# Patient Record
Sex: Male | Born: 1945 | Race: White | Hispanic: No | Smoking: Former smoker
Health system: Southern US, Community
[De-identification: ages and names within clinical notes are randomized; demographics above are authoritative.]

## PROBLEM LIST (undated history)

## (undated) DIAGNOSIS — I2510 Atherosclerotic heart disease of native coronary artery without angina pectoris: Secondary | ICD-10-CM

## (undated) DIAGNOSIS — I6529 Occlusion and stenosis of unspecified carotid artery: Secondary | ICD-10-CM

## (undated) DIAGNOSIS — I5022 Chronic systolic (congestive) heart failure: Secondary | ICD-10-CM

## (undated) DIAGNOSIS — I255 Ischemic cardiomyopathy: Secondary | ICD-10-CM

## (undated) DIAGNOSIS — I252 Old myocardial infarction: Secondary | ICD-10-CM

## (undated) DIAGNOSIS — E785 Hyperlipidemia, unspecified: Secondary | ICD-10-CM

## (undated) HISTORY — DX: Chronic systolic (congestive) heart failure: I50.22

## (undated) HISTORY — DX: Ischemic cardiomyopathy: I25.5

## (undated) HISTORY — DX: Atherosclerotic heart disease of native coronary artery without angina pectoris: I25.10

## (undated) HISTORY — DX: Hyperlipidemia, unspecified: E78.5

## (undated) HISTORY — DX: Occlusion and stenosis of unspecified carotid artery: I65.29

## (undated) HISTORY — PX: APPENDECTOMY: SHX54

## (undated) HISTORY — PX: PERCUTANEOUS CORONARY STENT INTERVENTION (PCI-S): SHX6016

## (undated) HISTORY — PX: HIP FRACTURE SURGERY: SHX118

## (undated) HISTORY — DX: Old myocardial infarction: I25.2

---

## 2014-09-28 DIAGNOSIS — D72829 Elevated white blood cell count, unspecified: Secondary | ICD-10-CM | POA: Diagnosis not present

## 2014-09-28 DIAGNOSIS — R110 Nausea: Secondary | ICD-10-CM | POA: Diagnosis not present

## 2014-09-28 DIAGNOSIS — S0101XA Laceration without foreign body of scalp, initial encounter: Secondary | ICD-10-CM | POA: Diagnosis not present

## 2014-09-28 DIAGNOSIS — S0990XA Unspecified injury of head, initial encounter: Secondary | ICD-10-CM | POA: Diagnosis not present

## 2014-09-28 DIAGNOSIS — J439 Emphysema, unspecified: Secondary | ICD-10-CM | POA: Diagnosis not present

## 2014-09-28 DIAGNOSIS — J449 Chronic obstructive pulmonary disease, unspecified: Secondary | ICD-10-CM | POA: Diagnosis not present

## 2014-09-28 DIAGNOSIS — R0989 Other specified symptoms and signs involving the circulatory and respiratory systems: Secondary | ICD-10-CM | POA: Diagnosis not present

## 2014-09-28 DIAGNOSIS — F17200 Nicotine dependence, unspecified, uncomplicated: Secondary | ICD-10-CM | POA: Diagnosis not present

## 2014-09-28 DIAGNOSIS — R739 Hyperglycemia, unspecified: Secondary | ICD-10-CM | POA: Diagnosis not present

## 2014-09-28 DIAGNOSIS — S0191XA Laceration without foreign body of unspecified part of head, initial encounter: Secondary | ICD-10-CM | POA: Diagnosis not present

## 2014-09-28 DIAGNOSIS — R61 Generalized hyperhidrosis: Secondary | ICD-10-CM | POA: Diagnosis not present

## 2014-09-28 DIAGNOSIS — F17210 Nicotine dependence, cigarettes, uncomplicated: Secondary | ICD-10-CM | POA: Diagnosis not present

## 2014-09-28 DIAGNOSIS — R55 Syncope and collapse: Secondary | ICD-10-CM | POA: Diagnosis not present

## 2015-01-08 DIAGNOSIS — H2513 Age-related nuclear cataract, bilateral: Secondary | ICD-10-CM | POA: Diagnosis not present

## 2015-01-08 DIAGNOSIS — H524 Presbyopia: Secondary | ICD-10-CM | POA: Diagnosis not present

## 2015-04-19 DIAGNOSIS — J441 Chronic obstructive pulmonary disease with (acute) exacerbation: Secondary | ICD-10-CM | POA: Diagnosis not present

## 2015-04-19 DIAGNOSIS — R0602 Shortness of breath: Secondary | ICD-10-CM | POA: Diagnosis not present

## 2015-04-19 DIAGNOSIS — Z720 Tobacco use: Secondary | ICD-10-CM | POA: Diagnosis not present

## 2015-04-27 DIAGNOSIS — J441 Chronic obstructive pulmonary disease with (acute) exacerbation: Secondary | ICD-10-CM | POA: Diagnosis not present

## 2015-04-27 DIAGNOSIS — F17210 Nicotine dependence, cigarettes, uncomplicated: Secondary | ICD-10-CM | POA: Diagnosis not present

## 2015-04-27 DIAGNOSIS — J449 Chronic obstructive pulmonary disease, unspecified: Secondary | ICD-10-CM | POA: Diagnosis not present

## 2015-07-25 DIAGNOSIS — I219 Acute myocardial infarction, unspecified: Secondary | ICD-10-CM | POA: Insufficient documentation

## 2015-07-25 DIAGNOSIS — I252 Old myocardial infarction: Secondary | ICD-10-CM

## 2015-07-25 HISTORY — DX: Old myocardial infarction: I25.2

## 2015-08-06 DIAGNOSIS — I2119 ST elevation (STEMI) myocardial infarction involving other coronary artery of inferior wall: Secondary | ICD-10-CM | POA: Diagnosis not present

## 2015-08-06 DIAGNOSIS — R001 Bradycardia, unspecified: Secondary | ICD-10-CM | POA: Diagnosis not present

## 2015-08-06 DIAGNOSIS — Z87891 Personal history of nicotine dependence: Secondary | ICD-10-CM | POA: Diagnosis not present

## 2015-08-06 DIAGNOSIS — I2510 Atherosclerotic heart disease of native coronary artery without angina pectoris: Secondary | ICD-10-CM | POA: Diagnosis not present

## 2015-08-06 DIAGNOSIS — I2582 Chronic total occlusion of coronary artery: Secondary | ICD-10-CM | POA: Diagnosis not present

## 2015-08-06 DIAGNOSIS — I2111 ST elevation (STEMI) myocardial infarction involving right coronary artery: Secondary | ICD-10-CM | POA: Diagnosis not present

## 2015-08-06 DIAGNOSIS — J439 Emphysema, unspecified: Secondary | ICD-10-CM | POA: Diagnosis not present

## 2015-08-06 DIAGNOSIS — E782 Mixed hyperlipidemia: Secondary | ICD-10-CM | POA: Diagnosis not present

## 2015-08-06 DIAGNOSIS — I213 ST elevation (STEMI) myocardial infarction of unspecified site: Secondary | ICD-10-CM | POA: Diagnosis not present

## 2015-08-07 DIAGNOSIS — Z87891 Personal history of nicotine dependence: Secondary | ICD-10-CM | POA: Diagnosis not present

## 2015-08-07 DIAGNOSIS — I2510 Atherosclerotic heart disease of native coronary artery without angina pectoris: Secondary | ICD-10-CM | POA: Diagnosis present

## 2015-08-07 DIAGNOSIS — Z951 Presence of aortocoronary bypass graft: Secondary | ICD-10-CM | POA: Diagnosis not present

## 2015-08-07 DIAGNOSIS — R001 Bradycardia, unspecified: Secondary | ICD-10-CM | POA: Diagnosis not present

## 2015-08-07 DIAGNOSIS — I2119 ST elevation (STEMI) myocardial infarction involving other coronary artery of inferior wall: Secondary | ICD-10-CM | POA: Diagnosis not present

## 2015-08-07 DIAGNOSIS — I213 ST elevation (STEMI) myocardial infarction of unspecified site: Secondary | ICD-10-CM | POA: Diagnosis present

## 2015-08-07 DIAGNOSIS — I25810 Atherosclerosis of coronary artery bypass graft(s) without angina pectoris: Secondary | ICD-10-CM | POA: Diagnosis not present

## 2015-08-07 DIAGNOSIS — E782 Mixed hyperlipidemia: Secondary | ICD-10-CM | POA: Diagnosis present

## 2015-08-22 ENCOUNTER — Other Ambulatory Visit: Payer: Self-pay

## 2015-08-22 ENCOUNTER — Telehealth: Payer: Self-pay | Admitting: Physician Assistant

## 2015-08-22 ENCOUNTER — Encounter: Payer: Self-pay | Admitting: Cardiovascular Disease

## 2015-08-22 ENCOUNTER — Ambulatory Visit (INDEPENDENT_AMBULATORY_CARE_PROVIDER_SITE_OTHER): Payer: Medicare Other | Admitting: Cardiovascular Disease

## 2015-08-22 VITALS — BP 122/74 | HR 102 | Ht 70.0 in | Wt 131.1 lb

## 2015-08-22 DIAGNOSIS — I2510 Atherosclerotic heart disease of native coronary artery without angina pectoris: Secondary | ICD-10-CM | POA: Diagnosis not present

## 2015-08-22 DIAGNOSIS — E785 Hyperlipidemia, unspecified: Secondary | ICD-10-CM | POA: Insufficient documentation

## 2015-08-22 DIAGNOSIS — I2119 ST elevation (STEMI) myocardial infarction involving other coronary artery of inferior wall: Secondary | ICD-10-CM | POA: Diagnosis not present

## 2015-08-22 DIAGNOSIS — R0989 Other specified symptoms and signs involving the circulatory and respiratory systems: Secondary | ICD-10-CM | POA: Diagnosis not present

## 2015-08-22 NOTE — Patient Instructions (Addendum)
Medication Instructions: 1) Increase toprol (metoprolol succinate) to 25 mg one whole tablet by mouth daily 2) Start aspirin 81 mg once daily  Labwork: - Your physician recommends that you return for FASTING lab work in: 4 weeks (same day as your stress test)- CBC/ CMET/ Lipid  Procedures/Testing: - Your physician has requested that you have an exercise stress myoview- in 4 weeks. For further information please visit [email or web address]. Please follow instruction sheet, as given.  - Your physician has requested that you have a carotid duplex- in 4 weeks. This test is an ultrasound of the carotid arteries in your neck. It looks at blood flow through these arteries that supply the brain with blood. Allow one hour for this exam. There are no restrictions or special instructions.  Follow-Up: - Your physician recommends that you schedule a follow-up appointment in: 6 weeks with the PA/ NP  - Your physician wants you to follow-up in: 4 months with Dr. Burt Knack (late January/ early February). You will receive a reminder letter in the mail two months in advance. If you don't receive a letter, please call our office to schedule the follow-up appointment.  Any Additional Special Instructions Will Be Listed Below (If Applicable).

## 2015-08-22 NOTE — Telephone Encounter (Signed)
ROI faxed to [facility], records rec back placed in chart prep bin.

## 2015-08-22 NOTE — Progress Notes (Signed)
Cardiology Office Note Date:  08/22/2015   ID:  Luke Anthony, DOB Apr 16, 1946, MRN [id number]  PCP:  No primary care provider on file.  Cardiologist:  Sherren Mocha, MD    Chief Complaint  Patient presents with  . Shortness of Breath    History of Present Illness: Luke Anthony is a 69 y.o. male who presents for initial cardiac evaluation. He recently sustained an inferior wall MI and was treated with primary PCI in [place], [place]. He presents today to establish ongoing outpatient cardiology care.  The day prior to his myocardial infarction, he noticed that he felt weak at bedtime then woke up at 1:30 am with severe diaphoresis. He then went to the bathroom and had nausea, vomiting, and diaphoresis. Later that morning, he developed pain in both shoulders and arms. He initially thought he had food poisoning but when he developed arm pain and numbness he had a friend drive him to the Emergency Room in [place]. He was treated by a personal friend of mine, [employer], who is a Film/video editor in [place], [place].   He was diagnosed with an inferior STEMI on arrival and was treated with primary PCI using multiple overlapping stents in the RCA with a good result. I do not have records available for review at this time, but the patient did bring his cardiac cath films for review. These films demonstrate total occlusion of the RCA treated with multiple overlapping stents with a good result, and he was TIMI-3 flow into both the PDA and PLA branches. There was moderate diffuse left mainstem stenosis estimated at approximately 50% by my review. There is diffuse irregularity of the LAD and left circumflex without significant stenoses in those vessels.  The patient is doing reasonably well at this time. He does complain of dyspnea with exertion. He smoked cigarettes heavily for over 50 years and just quit 4 months ago. He feels his breathing is a little worse since his heart attack. He denies  orthopnea, PND, or leg swelling. He's had no recurrence of diaphoresis, nausea, chest pain, or arm pain. He is active again and back to work. He travels frequently. He is not able to do cardiac rehabilitation because of his frequent travel.  Past Medical History  Diagnosis Date  . Appendicitis   . Heart attack 07/2015    History reviewed. No pertinent past surgical history.  Current Outpatient Prescriptions  Medication Sig Dispense Refill  . atorvastatin (LIPITOR) 80 MG tablet Take 80 mg by mouth at bedtime.  1  . BRILINTA 90 MG TABS tablet Take 90 mg by mouth 2 (two) times daily.  1  . metoprolol succinate (TOPROL-XL) 25 MG 24 hr tablet Take 12.5 mg by mouth at bedtime.  1  . NITROSTAT 0.4 MG SL tablet PLACE 1 TAB UNDER TONGUE AT ONSET OF CHEST PAIN; REPEAT EVERY 5 MIN X2 DOSES. IF NO RELIEF, GO TO ER  0   No current facility-administered medications for this visit.    Allergies:   Review of patient's allergies indicates no known allergies.   Social History:  The patient  reports that he quit smoking about 4 months ago. His smoking use included Cigarettes. He started smoking about 50 years ago. He has a 62.5 pack-year smoking history. He does not have any smokeless tobacco history on file. He reports that he drinks alcohol.   Family History:  The patient's  family history includes Cancer in his father and mother.   ROS:  Please see the  history of present illness.  Otherwise, review of systems is positive for shortness of breath, easy bruising.  All other systems are reviewed and negative.   PHYSICAL EXAM: VS:  BP 122/74 mmHg  Pulse 102  Ht 5\' 10"  (1.778 m)  Wt 131 lb 1.9 oz (59.476 kg)  BMI 18.81 kg/m2 , BMI Body mass index is 18.81 kg/(m^2). GEN: thin gentleman, in no acute distress HEENT: normal Neck: no JVD, no masses. Bilateral carotid bruits Cardiac: RRR without murmur or gallop, distant heart sounds            Respiratory:  clear to auscultation bilaterally, normal work of  breathing, prolonged expiratory phase GI: soft, nontender, nondistended, + BS MS: no deformity or atrophy Ext: no pretibial edema Skin: warm and dry, no rash Neuro:  Strength and sensation are intact Psych: euthymic mood, full affect  EKG:  EKG is ordered today. The ekg ordered today shows sinus tachycardia 102 bpm, ST and T-wave abnormality consider inferior ischemia, prolonged QT.  Recent Labs: No results found for requested labs within last 365 days.   Lipid Panel  No results found for: CHOL, TRIG, HDL, CHOLHDL, VLDL, LDLCALC, LDLDIRECT    Wt Readings from Last 3 Encounters:  08/22/15 131 lb 1.9 oz (59.476 kg)     Cardiac Studies Reviewed: Cardiac cath films reviewed.  ASSESSMENT AND PLAN: 1.  Inferior wall MI: The patient appears to be doing reasonably well now approximately 2 weeks out from his myocardial infarction. I have personally reviewed his cardiac catheterization films and he was treated with overlapping stents in the right coronary artery. Ventriculography demonstrated severe hypokinesis of the inferior wall, but otherwise preserved LV systolic function with an LVEF estimated at approximately 45-50%. The patient does have moderate left mainstem stenosis that I would estimate at 50%. I did not appreciate any significant flow-limiting disease in the LAD or left circumflex other than diffuse plaquing in those vessels. I've recommended the following:  Add ASA 81 mg daily  Increase Toprol XL to 25 mg daily  Check an exercise Myoview scan in 4 weeks to assess for ischemia in the setting of moderate left main disease  Otherwise continue current Rx  BP chronically low - no ACE/ARB at this point  2. Hyperlipidemia: appears to be tolerating atorvastatin at high-dose. Check lipids and LFT's when he returns for follow-up.  3. Bilateral carotid bruits: check duplex scan   Current medicines are reviewed with the patient today.  The patient does not have concerns regarding  medicines.  Labs/ tests ordered today include:  No orders of the defined types were placed in this encounter.    Disposition:   FU PA/NP in 6 weeks. I will see back in approximately 3-4 months  Signed, Sherren Mocha, MD  08/22/2015 11:21 AM    [facility] [address], [place], [place]  [address] Phone: [phone]; Fax: [phone]

## 2015-08-30 ENCOUNTER — Other Ambulatory Visit: Payer: Self-pay | Admitting: *Deleted

## 2015-08-30 MED ORDER — METOPROLOL SUCCINATE ER 25 MG PO TB24: 25.0000 mg | ORAL_TABLET | Freq: Every day | ORAL | Status: DC

## 2015-08-30 NOTE — Telephone Encounter (Signed)
Patient called and was upset that at his office visit on 08/22/15 he was told toId to increase toprol (metoprolol succinate) to 25 mg one whole tablet by mouth daily, but no rx was sent in. For todays dose he only took one-half which was his previous dose, as he was afraid that he would run out completely before the rx was sent in.

## 2015-09-04 ENCOUNTER — Other Ambulatory Visit: Payer: Self-pay

## 2015-09-04 MED ORDER — BRILINTA 90 MG PO TABS: 90.0000 mg | ORAL_TABLET | Freq: Two times a day (BID) | ORAL | Status: DC

## 2015-09-04 NOTE — Telephone Encounter (Signed)
Pt called to get his Brilinta 90 mg 1 BID refilled. He has an appointment next month and was last seen last month. So I did refill it for him.

## 2015-09-05 ENCOUNTER — Other Ambulatory Visit: Payer: Self-pay

## 2015-09-05 MED ORDER — ATORVASTATIN CALCIUM 80 MG PO TABS: 80.0000 mg | ORAL_TABLET | Freq: Every day | ORAL | Status: DC

## 2015-09-05 NOTE — Telephone Encounter (Signed)
Luke Mocha, MD at 08/22/2015 10:55 AM  atorvastatin (LIPITOR) 80 MG tabletTake 80 mg by mouth at bedtime 2. Hyperlipidemia: appears to be tolerating atorvastatin at high-dose. Check lipids and LFT's when he returns for follow-up.

## 2015-09-10 DIAGNOSIS — K37 Unspecified appendicitis: Secondary | ICD-10-CM | POA: Insufficient documentation

## 2015-09-11 ENCOUNTER — Telehealth (HOSPITAL_COMMUNITY): Payer: Self-pay

## 2015-09-11 NOTE — Telephone Encounter (Signed)
Patient given detailed instructions per Myocardial Perfusion Study Information Sheet for the test on 09/16/2015 at 0945. Patient notified to arrive at [place] [facility] for Carotid study, and that it is imperative to arrive on time for appointment to keep from having the test rescheduled.  If you need to cancel or reschedule your appointment, please call the office within 24 hours of your appointment. Failure to do so may result in a cancellation of your appointment, and a $50 no show fee. Patient verbalized understanding.Oletta Lamas, Sheilyn Boehlke A

## 2015-09-16 ENCOUNTER — Other Ambulatory Visit: Payer: Self-pay | Admitting: Cardiovascular Disease

## 2015-09-16 ENCOUNTER — Other Ambulatory Visit (INDEPENDENT_AMBULATORY_CARE_PROVIDER_SITE_OTHER): Payer: Medicare Other | Admitting: *Deleted

## 2015-09-16 ENCOUNTER — Ambulatory Visit (HOSPITAL_COMMUNITY)
Admission: RE | Admit: 2015-09-16 | Discharge: 2015-09-16 | Disposition: A | Discharge status: Home / Self Care | Payer: Medicare Other | Source: Ambulatory Visit | Attending: Cardiovascular Disease | Admitting: Cardiovascular Disease

## 2015-09-16 ENCOUNTER — Ambulatory Visit (HOSPITAL_BASED_OUTPATIENT_CLINIC_OR_DEPARTMENT_OTHER): Payer: Medicare Other

## 2015-09-16 DIAGNOSIS — I2510 Atherosclerotic heart disease of native coronary artery without angina pectoris: Secondary | ICD-10-CM

## 2015-09-16 DIAGNOSIS — I6523 Occlusion and stenosis of bilateral carotid arteries: Secondary | ICD-10-CM | POA: Insufficient documentation

## 2015-09-16 DIAGNOSIS — R9439 Abnormal result of other cardiovascular function study: Secondary | ICD-10-CM | POA: Insufficient documentation

## 2015-09-16 DIAGNOSIS — I2119 ST elevation (STEMI) myocardial infarction involving other coronary artery of inferior wall: Secondary | ICD-10-CM

## 2015-09-16 DIAGNOSIS — R0989 Other specified symptoms and signs involving the circulatory and respiratory systems: Secondary | ICD-10-CM | POA: Insufficient documentation

## 2015-09-16 DIAGNOSIS — E785 Hyperlipidemia, unspecified: Secondary | ICD-10-CM

## 2015-09-16 DIAGNOSIS — R55 Syncope and collapse: Secondary | ICD-10-CM | POA: Diagnosis not present

## 2015-09-16 LAB — CBC
HEMATOCRIT: 39.8 % (ref 39.0–52.0)
Hemoglobin: 13.8 g/dL (ref 13.0–17.0)
MCH: 29.9 pg (ref 26.0–34.0)
MCHC: 34.7 g/dL (ref 30.0–36.0)
MCV: 86.3 fL (ref 78.0–100.0)
RBC: 4.61 MIL/uL (ref 4.22–5.81)
RDW: 13.8 % (ref 11.5–15.5)
WBC: 9.2 10*3/uL (ref 4.0–10.5)

## 2015-09-16 LAB — MYOCARDIAL PERFUSION IMAGING
CHL CUP NUCLEAR SRS: 15
CHL CUP NUCLEAR SSS: 16
CHL CUP RESTING HR STRESS: 77 {beats}/min
CSEPED: 3 min
CSEPEDS: 30 s
CSEPPHR: 133 {beats}/min
Estimated workload: 5.1 METS
LV dias vol: 117 mL
LVSYSVOL: 78 mL
MPHR: 151 {beats}/min
NUC STRESS TID: 1.05
Percent HR: 88 %
RATE: 0.34
RPE: 19
SDS: 1

## 2015-09-16 LAB — COMPREHENSIVE METABOLIC PANEL
ALBUMIN: 4 g/dL (ref 3.6–5.1)
ALK PHOS: 80 U/L (ref 40–115)
ALT: 19 U/L (ref 9–46)
AST: 21 U/L (ref 10–35)
BILIRUBIN TOTAL: 0.8 mg/dL (ref 0.2–1.2)
BUN: 24 mg/dL (ref 7–25)
CO2: 23 mmol/L (ref 20–31)
CREATININE: 1.1 mg/dL (ref 0.70–1.25)
Calcium: 9.2 mg/dL (ref 8.6–10.3)
Chloride: 106 mmol/L (ref 98–110)
Glucose, Bld: 93 mg/dL (ref 65–99)
Potassium: 4.4 mmol/L (ref 3.5–5.3)
SODIUM: 140 mmol/L (ref 135–146)
TOTAL PROTEIN: 6.5 g/dL (ref 6.1–8.1)

## 2015-09-16 LAB — LIPID PANEL
CHOLESTEROL: 126 mg/dL (ref 125–200)
HDL: 41 mg/dL (ref >=40)
LDL CALC: 72 mg/dL (ref <130)
TRIGLYCERIDES: 65 mg/dL (ref <150)
Total CHOL/HDL Ratio: 3.1 Ratio (ref <=5.0)
VLDL: 13 mg/dL (ref <30)

## 2015-09-16 MED ORDER — TECHNETIUM TC 99M SESTAMIBI GENERIC - CARDIOLITE
10.6000 | Freq: Once | INTRAVENOUS | Status: AC | PRN
  Administered 2015-09-16: 11 via INTRAVENOUS

## 2015-09-16 MED ORDER — TECHNETIUM TC 99M SESTAMIBI GENERIC - CARDIOLITE
33.0000 | Freq: Once | INTRAVENOUS | Status: AC | PRN
  Administered 2015-09-16: 33 via INTRAVENOUS

## 2015-09-16 NOTE — Addendum Note (Signed)
Addended by: Eulis Foster on: 09/16/2015 10:32 AM   Modules accepted: Orders

## 2015-09-30 ENCOUNTER — Encounter: Payer: Self-pay | Admitting: Physician Assistant

## 2015-09-30 ENCOUNTER — Ambulatory Visit (INDEPENDENT_AMBULATORY_CARE_PROVIDER_SITE_OTHER): Payer: Medicare Other | Admitting: Physician Assistant

## 2015-09-30 ENCOUNTER — Encounter (HOSPITAL_COMMUNITY): Payer: Medicare Other

## 2015-09-30 VITALS — BP 122/84 | HR 78 | Ht 70.0 in | Wt 134.0 lb

## 2015-09-30 DIAGNOSIS — I255 Ischemic cardiomyopathy: Secondary | ICD-10-CM | POA: Diagnosis not present

## 2015-09-30 DIAGNOSIS — R0989 Other specified symptoms and signs involving the circulatory and respiratory systems: Secondary | ICD-10-CM

## 2015-09-30 DIAGNOSIS — I2510 Atherosclerotic heart disease of native coronary artery without angina pectoris: Secondary | ICD-10-CM | POA: Diagnosis not present

## 2015-09-30 DIAGNOSIS — I25812 Atherosclerosis of bypass graft of coronary artery of transplanted heart without angina pectoris: Secondary | ICD-10-CM

## 2015-09-30 DIAGNOSIS — E785 Hyperlipidemia, unspecified: Secondary | ICD-10-CM

## 2015-09-30 DIAGNOSIS — I2583 Coronary atherosclerosis due to lipid rich plaque: Secondary | ICD-10-CM

## 2015-09-30 MED ORDER — CLOPIDOGREL BISULFATE 75 MG PO TABS: 75.0000 mg | ORAL_TABLET | Freq: Every day | ORAL | Status: DC

## 2015-09-30 MED ORDER — ATORVASTATIN CALCIUM 80 MG PO TABS: 80.0000 mg | ORAL_TABLET | Freq: Every day | ORAL | Status: DC

## 2015-09-30 MED ORDER — METOPROLOL SUCCINATE ER 25 MG PO TB24: 25.0000 mg | ORAL_TABLET | Freq: Every day | ORAL | Status: DC

## 2015-09-30 NOTE — Assessment & Plan Note (Signed)
Patient has ischemic cardiomyopathy EF 33% on Myoview. We'll check 2-D echo next month to see if he's had recovery. May benefit from ACE inhibitor. Blood pressure is on the low side and patient is reluctant to take any more medications. Will hold off for now.

## 2015-09-30 NOTE — Assessment & Plan Note (Signed)
Dopplers were stable. Repeat in 1 year

## 2015-09-30 NOTE — Assessment & Plan Note (Signed)
Patient had an inferior MI in September treated in [place] with multiple overlapping stents in the RCA with good results. He was having some dyspnea on exertion and recent exercise Myoview showed no evidence of ischemia. EF was estimated at 33%. Dr. Burt Knack recommends 2-D echo 3 months from his MI.We'll schedule for next month. Patient does not want to pay for Brilinta. We'll switch to Plavix. Dr. Burt Knack recommends taking 300 mg the first day then 75 mg daily after that.  Follow-up with Dr. Burt Knack in 3-4 months.

## 2015-09-30 NOTE — Assessment & Plan Note (Signed)
Patient is on Lipitor. Lipids and LFTs were stable 09/16/15.

## 2015-09-30 NOTE — Patient Instructions (Signed)
Medication Instructions:   FINISH LAST PILLS OF BIRLINTA   THEN  START TAKING PLAVIX 75 MG   ON THE FIRST DAY TAKE 4 TABLETS AND THE START TAKING ONLY ONCE A DAY     If you need a refill on your cardiac medications before your next appointment, please call your pharmacy.  Labwork:NONE ORDER TODAY    Testing/Procedures: Your physician has requested that you have an echocardiogram. Echocardiography is a painless test that uses sound waves to create images of your heart. It provides your doctor with information about the size and shape of your heart and how well your heart's chambers and valves are working. This procedure takes approximately one hour. There are no restrictions for this procedure.     Follow-Up: WITH DR Burt Knack IN 3 TO 4 MONTHS    Any Other Special Instructions Will Be Listed Below (If Applicable).

## 2015-09-30 NOTE — Progress Notes (Signed)
Cardiology Office Note   Date:  09/30/2015   ID:  Luke Anthony, DOB Jul 31, 1946, MRN [id number]  PCP:  No PCP Per Patient  Cardiologist: Dr. Burt Knack Chief Complaint: Severe breath.    History of Present Illness: Luke Anthony is a 69 y.o. male who presents for follow up of stress test and carotids. He recently sustained an inferior wall MI and was treated with primary PCI in [place], [place] in September.   The day prior to his myocardial infarction, he noticed that he felt weak at bedtime then woke up at 1:30 am with severe diaphoresis. He then went to the bathroom and had nausea, vomiting, and diaphoresis. Later that morning, he developed pain in both shoulders and arms. He initially thought he had food poisoning but when he developed arm pain and numbness he had a friend drive him to the Emergency Room in [place]. He was treated by a personal friend of Dr. Burt Knack, Lennie Hummer, who is a cardiologist in [place], [place].   He was diagnosed with an inferior STEMI on arrival and was treated with primary PCI using multiple overlapping stents in the RCA with a good result. I do not have records available for review at this time, but the patient did bring his cardiac cath films for review. These films demonstrate total occlusion of the RCA treated with multiple overlapping stents with a good result, and he was TIMI-3 flow into both the PDA and PLA branches. There was moderate diffuse left mainstem stenosis estimated at approximately 50% by my review. There is diffuse irregularity of the LAD and left circumflex without significant stenoses in those vessels.  The patient is doing reasonably well at this time. He does complain of dyspnea with exertion. He smoked cigarettes heavily for over 50 years and just quit 4 months ago. He feels his breathing is a little worse since his heart attack. He denies orthopnea, PND, or leg swelling. He's had no recurrence of diaphoresis, nausea, chest pain, or arm  pain. He is active again and back to work. He travels frequently. He is not able to do cardiac rehabilitation because of his frequent travel.  Clear stress test 09/16/15 showed an EF of 33% with large defect of severe severity and fixed in the basal inferior and basal inferolateral, mid inferior, mid inferior lateral apical inferior and apical lateral location. This was consistent with scar. No significant ischemia noted. He had mild carotid disease with less than 40% stenosis bilaterally. Reviewed both these tests with patient.  Patient also is complaining of the cost of Brilinta and wants to switch. Dr. Burt Knack recommends switching him to Plavix.   Past Medical History  Diagnosis Date  . Appendicitis   . Heart attack ([place]) 07/2015  . Acute MI, inferior wall, initial episode of care ([facility]) 08/22/2015    No past surgical history on file.   Current Outpatient Prescriptions  Medication Sig Dispense Refill  . aspirin EC 81 MG tablet Take 1 tablet (81 mg total) by mouth daily.    Marland Kitchen atorvastatin (LIPITOR) 80 MG tablet Take 1 tablet (80 mg total) by mouth at bedtime. 30 tablet 0  . BRILINTA 90 MG TABS tablet Take 1 tablet (90 mg total) by mouth 2 (two) times daily. 60 tablet 1  . metoprolol succinate (TOPROL-XL) 25 MG 24 hr tablet Take 1 tablet (25 mg total) by mouth at bedtime. 30 tablet 1  . NITROSTAT 0.4 MG SL tablet PLACE 1 TAB UNDER TONGUE AT ONSET OF CHEST PAIN;  REPEAT EVERY 5 MIN X2 DOSES. IF NO RELIEF, GO TO ER  0   No current facility-administered medications for this visit.    Allergies:   Review of patient's allergies indicates no known allergies.    Social History:  The patient  reports that he quit smoking about 5 months ago. His smoking use included Cigarettes. He started smoking about 50 years ago. He has a 62.5 pack-year smoking history. He does not have any smokeless tobacco history on file. He reports that he drinks alcohol.   Family History:  The patient's   family history  includes Cancer in his father and mother.    ROS:  Please see the history of present illness.   Otherwise, review of systems are positive for none.   All other systems are reviewed and negative.    PHYSICAL EXAM: VS:  BP 122/84 mmHg  Pulse 78  Ht 5\' 10"  (1.778 m)  Wt 134 lb (60.782 kg)  BMI 19.23 kg/m2 , BMI Body mass index is 19.23 kg/(m^2). GEN: Well nourished, well developed, in no acute distress Neck: Bilateral carotid bruits no JVD, HJR, or masses Cardiac: RRR; positive S4, 2/6 systolic murmur at the left sternal border, no rubs, thrill or heave,  Respiratory: Crease breath sounds but clear to auscultation bilaterally, normal work of breathing GI: soft, nontender, nondistended, + BS MS: no deformity or atrophy Extremities: without cyanosis, clubbing, edema, good distal pulses bilaterally.  Skin: warm and dry, no rash Neuro:  Strength and sensation are intact    EKG:  EKG is not ordered today.    Recent Labs: 09/16/2015: ALT 19; BUN 24; Creat 1.10; Hemoglobin 13.8; Platelets SEE NOTE; Potassium 4.4; Sodium 140    Lipid Panel    Component Value Date/Time   CHOL 126 09/16/2015 1033   TRIG 65 09/16/2015 1033   HDL 41 09/16/2015 1033   CHOLHDL 3.1 09/16/2015 1033   VLDL 13 09/16/2015 1033   LDLCALC 72 09/16/2015 1033      Wt Readings from Last 3 Encounters:  09/30/15 134 lb (60.782 kg)  09/16/15 131 lb (59.421 kg)  08/22/15 131 lb 1.9 oz (59.476 kg)      Other studies Reviewed: Additional studies/ records that were reviewed today include and review of the records demonstrates:  Notes Recorded by Sherren Mocha, MD on 09/19/2015 at 6:18 AM Mild carotid disease with < 40% stenosis bilaterally, clinical follow-up recommended   Nuclear stress EF: 33%. There was no ST segment deviation noted during stress. T wave inversion was noted during stress in the II, III and aVF leads. Defect 1: There is a large defect of severe severity and fixed, present in the basal  inferior, basal inferolateral, mid inferior, mid inferolateral, apical inferior and apical lateral location. This is consistent with scar. The polar plot demonstrates slight reversibility on stress imaging but there is significant extracardiac activity on resting images accounting for slight discrepancy in uptake in the inferior wall and unlikely to represent ischemia. Defect 2: There is a small defect of severe severity and fixed, present in the apical septal and apex location. This is consistent with scar. No ischemia noted. The left ventricular ejection fraction is moderately decreased (30-44%).   ASSESSMENT AND PLAN:  CAD (coronary artery disease) Patient had an inferior MI in September treated in [place] with multiple overlapping stents in the RCA with good results. He was having some dyspnea on exertion and recent exercise Myoview showed no evidence of ischemia. EF was estimated at  33%. Dr. Burt Knack recommends 2-D echo 3 months from his MI.We'll schedule for next month. Patient does not want to pay for Brilinta. We'll switch to Plavix. Dr. Burt Knack recommends taking 300 mg the first day then 75 mg daily after that.  Follow-up with Dr. Burt Knack in 3-4 months.  Cardiomyopathy, ischemic Patient has ischemic cardiomyopathy EF 33% on Myoview. We'll check 2-D echo next month to see if he's had recovery. May benefit from Luke inhibitor. Blood pressure is on the low side and patient is reluctant to take any more medications. Will hold off for now.  Hyperlipidemia Patient is on Lipitor. Lipids and LFTs were stable 09/16/15.  Bilateral carotid bruits Dopplers were stable. Repeat in 1 year    Signed, Ermalinda Barrios, Hershal Coria  09/30/2015 8:31 AM    [facility] [address], [place], [place]  [address] Phone: [phone]; Fax: [phone]

## 2015-11-06 ENCOUNTER — Other Ambulatory Visit: Payer: Self-pay

## 2015-11-06 ENCOUNTER — Ambulatory Visit (HOSPITAL_COMMUNITY): Payer: Medicare Other | Attending: Cardiology

## 2015-11-06 DIAGNOSIS — I071 Rheumatic tricuspid insufficiency: Secondary | ICD-10-CM | POA: Diagnosis not present

## 2015-11-06 DIAGNOSIS — I2510 Atherosclerotic heart disease of native coronary artery without angina pectoris: Secondary | ICD-10-CM | POA: Insufficient documentation

## 2015-11-06 DIAGNOSIS — I340 Nonrheumatic mitral (valve) insufficiency: Secondary | ICD-10-CM | POA: Insufficient documentation

## 2015-11-06 DIAGNOSIS — R079 Chest pain, unspecified: Secondary | ICD-10-CM | POA: Insufficient documentation

## 2015-11-11 ENCOUNTER — Telehealth: Payer: Self-pay | Admitting: *Deleted

## 2015-11-11 NOTE — Telephone Encounter (Signed)
Called pt, per Estella Husk, PA-C, and made him aware of his ECHO results.  He was advised to keep his f/u appointment with Dr. Burt Knack, which is 01/29/16.   Pt verbalized understanding.

## 2015-11-11 NOTE — Telephone Encounter (Signed)
-----   Message from Imogene Burn, PA-C sent at 11/10/2015 10:46 PM EST ----- Heart function remains the same about 30-35%. F/u with Dr. Burt Knack as scheduled

## 2016-01-29 ENCOUNTER — Encounter: Payer: Self-pay | Admitting: Cardiovascular Disease

## 2016-01-29 ENCOUNTER — Ambulatory Visit (INDEPENDENT_AMBULATORY_CARE_PROVIDER_SITE_OTHER): Payer: Medicare Other | Admitting: Cardiovascular Disease

## 2016-01-29 VITALS — BP 124/64 | HR 84 | Ht 70.0 in | Wt 141.8 lb

## 2016-01-29 DIAGNOSIS — I255 Ischemic cardiomyopathy: Secondary | ICD-10-CM | POA: Diagnosis not present

## 2016-01-29 DIAGNOSIS — I2510 Atherosclerotic heart disease of native coronary artery without angina pectoris: Secondary | ICD-10-CM

## 2016-01-29 DIAGNOSIS — R0602 Shortness of breath: Secondary | ICD-10-CM | POA: Diagnosis not present

## 2016-01-29 MED ORDER — SACUBITRIL-VALSARTAN 24-26 MG PO TABS: 1.0000 | ORAL_TABLET | Freq: Two times a day (BID) | ORAL | Status: DC

## 2016-01-29 NOTE — Progress Notes (Signed)
Cardiology Office Note Date:  01/29/2016   ID:  Daquan Rhoton, DOB 1946-04-04, MRN [id number]  PCP:  No PCP Per Patient  Cardiologist:  Sherren Mocha, MD    Chief Complaint  Patient presents with  . scheduled follow up    CAD.     History of Present Illness: Luke Anthony is a 70 y.o. male who presents for Follow-up of coronary artery disease and old MI. The patient initially presented with an acute inferior wall MI in September 2016. He was treated with primary PCI using multiple overlapping stents in the right coronary artery. He was also noted to have moderate diffuse left mainstem stenosis estimated at 50% as well as diffuse irregularity of the LAD and left circumflex without significant stenoses the nose vessels. The patient had a follow-up echocardiogram in December 2016 demonstrating severely reduced LV systolic function with an estimated LVEF of 30-35%. Initially his medical therapy was limited because of low blood pressure and he was treated with metoprolol succinate without an ACE or ARB. He presents today for follow-up evaluation.  The patient complains of exertional dyspnea. This is essentially unchanged over a long time as the patient has a history of very heavy cigarette use. He quit smoking last year. He denies orthopnea, PND, or chest pain/pressure. He denies edema, heart palpitations, or lightheadedness. He remains physically active and travels regularly.   Past Medical History  Diagnosis Date  . Appendicitis   . Heart attack ([place]) 07/2015  . Acute MI, inferior wall, initial episode of care ([place]) 08/22/2015    No past surgical history on file.  Current Outpatient Prescriptions  Medication Sig Dispense Refill  . aspirin EC 81 MG tablet Take 1 tablet (81 mg total) by mouth daily.    Marland Kitchen atorvastatin (LIPITOR) 80 MG tablet Take 1 tablet (80 mg total) by mouth at bedtime. 30 tablet 6  . clopidogrel (PLAVIX) 75 MG tablet Take 1 tablet (75 mg total) by mouth daily. 90 tablet 2    . metoprolol succinate (TOPROL-XL) 25 MG 24 hr tablet Take 1 tablet (25 mg total) by mouth at bedtime. 30 tablet 6  . NITROSTAT 0.4 MG SL tablet PLACE 1 TAB UNDER TONGUE AT ONSET OF CHEST PAIN; REPEAT EVERY 5 MIN X2 DOSES. IF NO RELIEF, GO TO ER  0  . sacubitril-valsartan (ENTRESTO) 24-26 MG Take 1 tablet by mouth 2 (two) times daily. 60 tablet 6   No current facility-administered medications for this visit.    Allergies:   Review of patient's allergies indicates no known allergies.   Social History:  The patient  reports that he quit smoking about 9 months ago. His smoking use included Cigarettes. He started smoking about 51 years ago. He has a 62.5 pack-year smoking history. He does not have any smokeless tobacco history on file. He reports that he drinks alcohol.   Family History:  The patient's family history includes Cancer in his father and mother.   ROS:  Please see the history of present illness. All other systems are reviewed and negative.   PHYSICAL EXAM: VS:  BP 124/64 mmHg  Pulse 84  Ht 5\' 10"  (1.778 m)  Wt 141 lb 12.8 oz (64.32 kg)  BMI 20.35 kg/m2 , BMI Body mass index is 20.35 kg/(m^2). GEN: Well nourished, well developed, in no acute distress HEENT: normal Neck: no JVD, no masses. Bilateral carotid bruits Cardiac: RRR without murmur or gallop  Respiratory:  clear to auscultation bilaterally, normal work of breathing GI: soft, nontender, nondistended, + BS MS: no deformity or atrophy Ext: no pretibial edema, pedal pulses 2+= bilaterally Skin: warm and dry, no rash Neuro:  Strength and sensation are intact Psych: euthymic mood, full affect  EKG:  EKG is ordered today. The ekg ordered today shows normal sinus rhythm 87 bpm, ST and T wave abnormality consider inferolateral ischemia. No significant change from previous tracing.  Recent Labs: 09/16/2015: ALT 19; BUN 24; Creat 1.10; Hemoglobin 13.8; Platelets SEE NOTE; Potassium 4.4; Sodium 140   Lipid  Panel     Component Value Date/Time   CHOL 126 09/16/2015 1033   TRIG 65 09/16/2015 1033   HDL 41 09/16/2015 1033   CHOLHDL 3.1 09/16/2015 1033   VLDL 13 09/16/2015 1033   LDLCALC 72 09/16/2015 1033      Wt Readings from Last 3 Encounters:  01/29/16 141 lb 12.8 oz (64.32 kg)  09/30/15 134 lb (60.782 kg)  09/16/15 131 lb (59.421 kg)   Cardiac Studies Reviewed: Myoview Scan 09-16-2015: Study Highlights     Nuclear stress EF: 33%.  There was no ST segment deviation noted during stress.  T wave inversion was noted during stress in the II, III and aVF leads.  Defect 1: There is a large defect of severe severity and fixed, present in the basal inferior, basal inferolateral, mid inferior, mid inferolateral, apical inferior and apical lateral location. This is consistent with scar. The polar plot demonstrates slight reversibility on stress imaging but there is significant extracardiac activity on resting images accounting for slight discrepancy in uptake in the inferior wall and unlikely to represent ischemia.  Defect 2: There is a small defect of severe severity and fixed, present in the apical septal and apex location. This is consistent with scar. No ischemia noted.  The left ventricular ejection fraction is moderately decreased (30-44%).    2D Echo 11-06-2015: Study Conclusions  - Left ventricle: There is hypokinesis of the basal and mid  inferolateral, inferior and apical lateral and inferior walls.  The cavity size was normal. Systolic function was moderately to  severely reduced. The estimated ejection fraction was in the  range of 30% to 35%. Features are consistent with a pseudonormal  left ventricular filling pattern, with concomitant abnormal  relaxation and increased filling pressure (grade 2 diastolic  dysfunction). Doppler parameters are consistent with elevated  ventricular end-diastolic filling pressure. - Aortic valve: Trileaflet; normal thickness  leaflets.  Transvalvular velocity was within the normal range. There was no  stenosis. There was no regurgitation. - Aortic root: The aortic root was normal in size. - Ascending aorta: The ascending aorta was normal in size. - Mitral valve: Structurally normal valve. There was mild  regurgitation. - Right ventricle: The cavity size was normal. Wall thickness was  normal. Systolic function was normal. - Right atrium: The atrium was normal in size. - Tricuspid valve: There was trivial regurgitation. - Pulmonic valve: There was no regurgitation. - Pulmonary arteries: Systolic pressure was within the normal  range. - Inferior vena cava: The vessel was normal in size. - Pericardium, extracardiac: There was no pericardial effusion.  ASSESSMENT AND PLAN: 1.  CAD, native vessel, with old MI: Continue ASA, brilinta. No recurrent angina. Continue statin and beta-blocker.  2. Chronic systolic heart failure with NYHA II sx's. Severe LV dysfunction by recent echo. He has not been on optimal medical therapy because of low BP soon after his MI. BP now improved  and I have recommended that he start Entresto 26/24 mg BID and will titrate up as tolerated. Would like to check a Cardiac MRI after 3 months of Rx to reassess LV function. Note he has a narrow QRS complex. We discussed consideration around ICD therapy as primary prevention of SCD. Will refer to EP if his LVEF remains < 35% after 3 months on toprol XL and Entresto.   3. Hyperlipidemia: continue statin Rx.   4. Tobacco abuse: longstanding but remains quit. He was encouraged to continue to abstain from cigarettes.   5. Carotid stenosis with no hx of stroke: duplex reviewed from 09-16-2015 and he has <40% stenosis bilaterally  Current medicines are reviewed with the patient today.  The patient does not have concerns regarding medicines.  Labs/ tests ordered today include:   Orders Placed This Encounter  Procedures  . MR Card Morphology  Wo/W Cm  . Basic Metabolic Panel (BMET)  . B Nat Peptide  . EKG 12-Lead    Disposition:   FU one month with APP for further medication titration  Signed, Sherren Mocha, MD  01/29/2016 12:09 PM    [facility] [address], [place], [place]  [address] Phone: [phone]; Fax: [phone]

## 2016-01-29 NOTE — Patient Instructions (Signed)
Medication Instructions:  Your physician has recommended you make the following change in your medication:  1. START Entresto 24/26mg  take one tablet by mouth twice a day  Labwork: Your physician recommends that you return for lab work in: 3 WEEKS (BMP and BNP)  Testing/Procedures: Your physician has requested that you have a cardiac MRI in 3 MONTHS. Cardiac MRI uses a computer to create images of your heart as its beating, producing both still and moving pictures of your heart and major blood vessels. For further information please visit [email or web address] Please follow the instruction sheet given to you today for more information.  Follow-Up: Your physician recommends that you schedule a follow-up appointment in: 4 WEEKS with PA/NP   Any Other Special Instructions Will Be Listed Below (If Applicable).     If you need a refill on your cardiac medications before your next appointment, please call your pharmacy.

## 2016-01-30 ENCOUNTER — Encounter: Payer: Self-pay | Admitting: Cardiovascular Disease

## 2016-02-12 ENCOUNTER — Telehealth: Payer: Self-pay

## 2016-02-12 DIAGNOSIS — J36 Peritonsillar abscess: Secondary | ICD-10-CM | POA: Diagnosis not present

## 2016-02-12 DIAGNOSIS — Z87891 Personal history of nicotine dependence: Secondary | ICD-10-CM | POA: Diagnosis not present

## 2016-02-12 DIAGNOSIS — R221 Localized swelling, mass and lump, neck: Secondary | ICD-10-CM | POA: Diagnosis not present

## 2016-02-12 DIAGNOSIS — J020 Streptococcal pharyngitis: Secondary | ICD-10-CM | POA: Diagnosis not present

## 2016-02-12 DIAGNOSIS — I10 Essential (primary) hypertension: Secondary | ICD-10-CM | POA: Diagnosis not present

## 2016-02-12 NOTE — Telephone Encounter (Signed)
Fritz Pickerel, [facility] from [facility] called to request an alternative for [facility].  He reports the patient will have to pay $400 monthly for the medication. He st the medication is not on the formulary so tier exception and prior authorization will not help with cost. Fritz Pickerel requests a call back for instructions at phone number [phone] and to say "Code 3" when pharmacy answers to get him.   To Dr. Burt Knack.

## 2016-02-12 NOTE — Telephone Encounter (Signed)
Fritz Pickerel from [facility] called and stated that they found a coupon for the entresto and everything is good.

## 2016-02-15 NOTE — Telephone Encounter (Signed)
Ok to try to get into patient assistance program. Otherwise he should go back on previous Rx. thx

## 2016-02-17 DIAGNOSIS — R221 Localized swelling, mass and lump, neck: Secondary | ICD-10-CM | POA: Diagnosis not present

## 2016-02-19 ENCOUNTER — Other Ambulatory Visit (INDEPENDENT_AMBULATORY_CARE_PROVIDER_SITE_OTHER): Payer: Medicare Other | Admitting: *Deleted

## 2016-02-19 DIAGNOSIS — I2510 Atherosclerotic heart disease of native coronary artery without angina pectoris: Secondary | ICD-10-CM | POA: Diagnosis not present

## 2016-02-19 DIAGNOSIS — R0602 Shortness of breath: Secondary | ICD-10-CM

## 2016-02-19 DIAGNOSIS — I255 Ischemic cardiomyopathy: Secondary | ICD-10-CM

## 2016-02-19 DIAGNOSIS — I2589 Other forms of chronic ischemic heart disease: Secondary | ICD-10-CM | POA: Diagnosis not present

## 2016-02-19 LAB — BASIC METABOLIC PANEL
BUN: 22 mg/dL (ref 7–25)
CHLORIDE: 106 mmol/L (ref 98–110)
CO2: 21 mmol/L (ref 20–31)
Calcium: 8.8 mg/dL (ref 8.6–10.3)
Creat: 1.15 mg/dL (ref 0.70–1.25)
Glucose, Bld: 90 mg/dL (ref 65–99)
POTASSIUM: 4.8 mmol/L (ref 3.5–5.3)
Sodium: 137 mmol/L (ref 135–146)

## 2016-02-19 LAB — BRAIN NATRIURETIC PEPTIDE: Brain Natriuretic Peptide: 132.3 pg/mL — ABNORMAL HIGH (ref <100)

## 2016-02-26 NOTE — Progress Notes (Signed)
Cardiology Office Note:    Date:  02/27/2016   ID:  Luke Anthony, DOB Mar 03, 1946, MRN [id number]  PCP:  No PCP Per Patient  Cardiologist:  Dr. Sherren Mocha   Electrophysiologist:  n/a  Referring MD: Sherren Mocha, MD   Chief Complaint  Patient presents with  . Congestive Heart Failure    Follow up; Entresto started    History of Present Illness:     Luke Anthony is a 70 y.o. male with a hx of CAD status post inferior MI 9/16 treated with primary PCI using multiple overlapping non-drug-eluting stents to the RCA. He has moderate diffuse left main stenosis estimated at 50% as well as diffuse irregularity of the LAD and LCx without significant stenoses. Follow-up echo in 12/16 demonstrated EF 30-35%. Initially, medical therapy was limited by low blood pressure. Last seen by Dr. Burt Knack 01/29/16. He was placed on Entresto to further maximize his CHF therapy. Plan is to obtain cardiac MRI after 3 months of optimized medical therapy.   He returns for follow-up. Here alone.  The patient denies chest pain, syncope, orthopnea, PND or significant pedal edema.  He has chronic SOB with activity that is unchanged.  NYHA 2-2b.   Past Medical History  Diagnosis Date  . Appendicitis   . 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 CAD (coronary artery disease)     a. Inf MI 9/16 >> BMS x 4 to RCA; residual - oLM 60, p-mLAD 70, o-pD1 70-80, o-pLCx 60-70, EF 45%  //  b. Myoview 10/16 - inf scar, apical septal and apical scar, EF 33% int risk    . Ischemic cardiomyopathy     a. Echo 12/16 - Inf-lat, inf, apical lateral and inf HK, EF 30-35%, Gr 2 DD, mild MR  . Chronic systolic CHF (congestive heart failure) ([place])   . HLD (hyperlipidemia)   . Carotid stenosis     [place] 10/16 - Bilateral 1-39%    No past surgical history on file.  Current Medications: Outpatient Prescriptions Prior to Visit  Medication Sig Dispense Refill  . aspirin EC 81 MG tablet Take 1 tablet (81 mg total) by mouth daily.    Marland Kitchen  atorvastatin (LIPITOR) 80 MG tablet Take 1 tablet (80 mg total) by mouth at bedtime. 30 tablet 6  . clopidogrel (PLAVIX) 75 MG tablet Take 1 tablet (75 mg total) by mouth daily. 90 tablet 2  . metoprolol succinate (TOPROL-XL) 25 MG 24 hr tablet Take 1 tablet (25 mg total) by mouth at bedtime. 30 tablet 6  . NITROSTAT 0.4 MG SL tablet PLACE 1 TAB UNDER TONGUE AT ONSET OF CHEST PAIN; REPEAT EVERY 5 MIN X2 DOSES. IF NO RELIEF, GO TO ER  0  . sacubitril-valsartan (ENTRESTO) 24-26 MG Take 1 tablet by mouth 2 (two) times daily. 60 tablet 6   No facility-administered medications prior to visit.     Allergies:   Review of patient's allergies indicates no known allergies.   Social History   Social History  . Marital Status: Widowed    Spouse Name: N/A  . Number of Children: N/A  . Years of Education: N/A   Social History Main Topics  . Smoking status: Former Smoker -- 1.25 packs/day for 50 years    Types: Cigarettes    Start date: 11/23/1964    Quit date: 04/21/2015  . Smokeless tobacco: None  . Alcohol Use: 0.0 oz/week    0 Standard drinks or equivalent per week     Comment:  social about 4 drinks a weeek  . Drug Use: None  . Sexual Activity:    Partners: Male   Other Topics Concern  . None   Social History Narrative     Family History:  The patient's family history includes Cancer in his father and mother.   ROS:   Please see the history of present illness.    ROS All other systems reviewed and are negative.   Physical Exam:    VS:  BP 90/48 mmHg  Pulse 62  Ht 5\' 10"  (1.778 m)  Wt 142 lb 12.8 oz (64.774 kg)  BMI 20.49 kg/m2   GEN: Well nourished, well developed, in no acute distress HEENT: normal Neck: no JVD, no masses Cardiac: Normal S1/S2, RRR; no murmurs, rubs, or gallops, no edema;    Respiratory:  clear to auscultation bilaterally; no wheezing, rhonchi or rales GI: soft, nontender, nondistended MS: no deformity or atrophy Skin: warm and dry Neuro: No focal  deficits  Psych: Alert and oriented x 3, normal affect  Wt Readings from Last 3 Encounters:  02/27/16 142 lb 12.8 oz (64.774 kg)  01/29/16 141 lb 12.8 oz (64.32 kg)  09/30/15 134 lb (60.782 kg)      Studies/Labs Reviewed:     EKG:  EKG is  ordered today.  The ekg ordered today demonstrates NSR, HR 61, normal axis, TWI 2, 3, aVF, QTc 436 ms, no change since prior tracing.  Recent Labs: 09/16/2015: ALT 19; Hemoglobin 13.8; Platelets SEE NOTE 02/19/2016: BUN 22; Creat 1.15; Potassium 4.8; Sodium 137   Recent Lipid Panel    Component Value Date/Time   CHOL 126 09/16/2015 1033   TRIG 65 09/16/2015 1033   HDL 41 09/16/2015 1033   CHOLHDL 3.1 09/16/2015 1033   VLDL 13 09/16/2015 1033   LDLCALC 72 09/16/2015 1033    Additional studies/ records that were reviewed today include:   Echo 12/16 Inf-lat, inf, apical lateral and inf HK, EF 30-35%, Gr 2 DD, mild MR  Myoview 10/16 Myocardial perfusion is abnormal. There is a large defect of severe severity present in the basal inferior, basal inferolateral, mid inferior, mid inferolateral, apical inferior and apical lateral location. The defect is non-reversible. This is consistent with scar. The polar plot demonstrates slight reversibility on stress imaging but there is significant extracardiac activity on resting images accounting for slight discrepancy in uptake in the inferior wall and unlikely to represent ischemia.  There is a small defect of severe severity present in the apical septal and apex location. The defect is non-reversible and consistent with prior infarct. No ischemia noted. This is an intermediate risk study. Overall left ventricular systolic function was abnormal. LV cavity size is mildly enlarged. Nuclear stress EF: 33%. The left ventricular ejection fraction is moderately decreased (30-44%). There is no prior study for comparison.  Carotid US 10/16 Bilateral 1-39%  LHC 9/16 [facility] in [place],  [place]) [place] ostial 60% LAD prox and mid 70%, D1 ostial and prox 70-80% LCx ostial and prox 60-70% RCA ostial 80%, prox and mid 80-90%, dist 100%; PLB 70-80% (not stented) EF 45% PCI: Vision BMS x 4 to RCA  ASSESSMENT:     1. Coronary artery disease involving native coronary artery of native heart without angina pectoris   2. Chronic systolic CHF (congestive heart failure) ([place])   3. Ischemic cardiomyopathy   4. Hyperlipidemia     PLAN:     In order of problems listed above:  1. CAD -  s/p Inf MI tx with BMS x 4 to RCA in 2016.  No angina.  Continue ASA, Plavix, statin, beta-blocker, angiotensin receptor blocker.  2. Chronic systolic CHF - NYHA [id number].  Volume stable on no diuretics.  -  Continue low dose beta-blocker, sacubitril/valsartan.  -  BP limits titration of meds.  BP will not tolerate Spironolactone.  -  No further changes today.  3. Ischemic CM - FU MRI in 3 mos.  IF EF < 35%, refer to EP for ICD.  4. HL - Continue statin.     Medication Adjustments/Labs and Tests Ordered: Current medicines are reviewed at length with the patient today.  Concerns regarding medicines are outlined above.  Medication changes, Labs and Tests ordered today are outlined in the Patient Instructions noted below. Patient Instructions  Medication Instructions:  Your physician recommends that you continue on your current medications as directed. Please refer to the Current Medication list given to you today. Labwork: NONE Testing/Procedures: NONE Follow-Up: 06/24/16 10 AM WITH DR. Burt Knack Any Other Special Instructions Will Be Listed Below (If Applicable). If you need a refill on your cardiac medications before your next appointment, please call your pharmacy.    Signed, Richardson Dopp, PA-C  02/27/2016 9:35 PM    [facility] [place], [place], [place]  [address] Phone: [phone]; Fax: [phone]

## 2016-02-27 ENCOUNTER — Encounter: Payer: Self-pay | Admitting: Physician Assistant

## 2016-02-27 ENCOUNTER — Ambulatory Visit (INDEPENDENT_AMBULATORY_CARE_PROVIDER_SITE_OTHER): Payer: Medicare Other | Admitting: Physician Assistant

## 2016-02-27 VITALS — BP 90/48 | HR 62 | Ht 70.0 in | Wt 142.8 lb

## 2016-02-27 DIAGNOSIS — I5022 Chronic systolic (congestive) heart failure: Secondary | ICD-10-CM

## 2016-02-27 DIAGNOSIS — I2510 Atherosclerotic heart disease of native coronary artery without angina pectoris: Secondary | ICD-10-CM

## 2016-02-27 DIAGNOSIS — E785 Hyperlipidemia, unspecified: Secondary | ICD-10-CM | POA: Diagnosis not present

## 2016-02-27 DIAGNOSIS — I255 Ischemic cardiomyopathy: Secondary | ICD-10-CM | POA: Diagnosis not present

## 2016-02-27 NOTE — Patient Instructions (Addendum)
Medication Instructions:  Your physician recommends that you continue on your current medications as directed. Please refer to the Current Medication list given to you today. Labwork: NONE Testing/Procedures: NONE Follow-Up: 06/24/16 10 AM WITH DR. Burt Knack Any Other Special Instructions Will Be Listed Below (If Applicable). If you need a refill on your cardiac medications before your next appointment, please call your pharmacy.

## 2016-04-27 ENCOUNTER — Other Ambulatory Visit: Payer: Self-pay | Admitting: *Deleted

## 2016-04-27 MED ORDER — METOPROLOL SUCCINATE ER 25 MG PO TB24: 25.0000 mg | ORAL_TABLET | Freq: Every day | ORAL | Status: DC

## 2016-04-27 MED ORDER — ATORVASTATIN CALCIUM 80 MG PO TABS: 80.0000 mg | ORAL_TABLET | Freq: Every day | ORAL | Status: DC

## 2016-04-30 ENCOUNTER — Ambulatory Visit (HOSPITAL_COMMUNITY)
Admission: RE | Admit: 2016-04-30 | Discharge: 2016-04-30 | Disposition: A | Discharge status: Home / Self Care | Payer: Medicare Other | Source: Ambulatory Visit | Attending: Cardiovascular Disease | Admitting: Cardiovascular Disease

## 2016-04-30 DIAGNOSIS — I255 Ischemic cardiomyopathy: Secondary | ICD-10-CM | POA: Insufficient documentation

## 2016-04-30 DIAGNOSIS — R0602 Shortness of breath: Secondary | ICD-10-CM | POA: Insufficient documentation

## 2016-04-30 DIAGNOSIS — R29898 Other symptoms and signs involving the musculoskeletal system: Secondary | ICD-10-CM | POA: Insufficient documentation

## 2016-04-30 DIAGNOSIS — I2510 Atherosclerotic heart disease of native coronary artery without angina pectoris: Secondary | ICD-10-CM

## 2016-04-30 LAB — CREATININE, SERUM
Creatinine, Ser: 1.59 mg/dL — ABNORMAL HIGH (ref 0.61–1.24)
GFR calc non Af Amer: 43 mL/min — ABNORMAL LOW (ref >60)
GFR, EST AFRICAN AMERICAN: 49 mL/min — AB (ref >60)

## 2016-04-30 MED ORDER — GADOBENATE DIMEGLUMINE 529 MG/ML IV SOLN: 22.0000 mL | Freq: Once | INTRAVENOUS | Status: DC | PRN

## 2016-05-12 ENCOUNTER — Other Ambulatory Visit: Payer: Self-pay

## 2016-05-12 DIAGNOSIS — R944 Abnormal results of kidney function studies: Secondary | ICD-10-CM

## 2016-05-28 ENCOUNTER — Inpatient Hospital Stay (HOSPITAL_COMMUNITY): Admission: RE | Admit: 2016-05-28 | Payer: Medicare Other | Source: Ambulatory Visit

## 2016-05-28 ENCOUNTER — Institutional Professional Consult (permissible substitution): Payer: Medicare Other | Admitting: Internal Medicine

## 2016-06-03 ENCOUNTER — Ambulatory Visit (INDEPENDENT_AMBULATORY_CARE_PROVIDER_SITE_OTHER): Payer: Medicare Other | Admitting: Internal Medicine

## 2016-06-03 ENCOUNTER — Ambulatory Visit (HOSPITAL_COMMUNITY)
Admission: RE | Admit: 2016-06-03 | Discharge: 2016-06-03 | Disposition: A | Discharge status: Home / Self Care | Payer: Medicare Other | Source: Ambulatory Visit | Attending: Cardiology | Admitting: Cardiology

## 2016-06-03 ENCOUNTER — Encounter: Payer: Self-pay | Admitting: Internal Medicine

## 2016-06-03 VITALS — BP 108/62 | HR 86 | Ht 70.0 in | Wt 138.0 lb

## 2016-06-03 DIAGNOSIS — I700 Atherosclerosis of aorta: Secondary | ICD-10-CM | POA: Insufficient documentation

## 2016-06-03 DIAGNOSIS — I255 Ischemic cardiomyopathy: Secondary | ICD-10-CM | POA: Insufficient documentation

## 2016-06-03 DIAGNOSIS — R944 Abnormal results of kidney function studies: Secondary | ICD-10-CM | POA: Diagnosis not present

## 2016-06-03 DIAGNOSIS — I2510 Atherosclerotic heart disease of native coronary artery without angina pectoris: Secondary | ICD-10-CM | POA: Insufficient documentation

## 2016-06-03 DIAGNOSIS — E785 Hyperlipidemia, unspecified: Secondary | ICD-10-CM | POA: Insufficient documentation

## 2016-06-03 DIAGNOSIS — I5022 Chronic systolic (congestive) heart failure: Secondary | ICD-10-CM | POA: Diagnosis not present

## 2016-06-03 NOTE — Patient Instructions (Signed)
Medication Instructions:  Your physician recommends that you continue on your current medications as directed. Please refer to the Current Medication list given to you today.  Labwork: None ordered.  Testing/Procedures: None ordered.  Follow-Up: Your physician recommends that you schedule a follow-up appointment as needed.   Any Other Special Instructions Will Be Listed Below (If Applicable).     If you need a refill on your cardiac medications before your next appointment, please call your pharmacy.   

## 2016-06-03 NOTE — Progress Notes (Signed)
Electrophysiology Office Note   Date:  06/03/2016   ID:  Luke Anthony, DOB 23-Mar-1946, MRN [id number]  PCP:  none Cardiologist:  Dr Burt Knack Primary Electrophysiologist: Thompson Grayer, MD    Chief Complaint  Patient presents with  . Chronic systolic heart failure     History of Present Illness: Luke Anthony is a 70 y.o. male who presents today for electrophysiology evaluation.   He has an ischemic CM  (EF 32%), NYHA Class II CHF and CAD.  He initially presented with an acute inferior wall MI in September 2016 in [place]. He was treated with primary PCI using multiple overlapping stents in the right coronary artery. He was also noted to have moderate diffuse left mainstem stenosis estimated at 50% as well as diffuse irregularity of the LAD and left circumflex without significant stenoses the nose vessels. The patient had a follow-up echocardiogram in December 2016 demonstrating severely reduced LV systolic function with an estimated LVEF of 30-35%.  He has been treated with beta blocker therapy.  He was placed on entresto but did not tolerate this due to hypotension.  He took it for several months. He states "thats the worst medicine Ive ever seen in my life".  MRI reveals EF 32% despite medical therapy. Presently, he is active as a Web designer.  He reports SOB with moderate activity.  He is not very active.  He finds that in hot weather he is more limited.   Today, he denies symptoms of palpitations, chest pain, shortness of breath, orthopnea, PND, lower extremity edema, claudication, dizziness (resovled off of entresto), presyncope, syncope, bleeding, or neurologic sequela. The patient is tolerating medications without difficulties and is otherwise without complaint today.    Past Medical History  Diagnosis Date  . 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 CAD (coronary artery disease)     a. Inf MI 9/16 >> BMS x 4 to RCA; residual - oLM 60, p-mLAD 70, o-pD1 70-80, o-pLCx 60-70, EF 45%  //   b. Myoview 10/16 - inf scar, apical septal and apical scar, EF 33% int risk    . Ischemic cardiomyopathy     a. Echo 12/16 - Inf-lat, inf, apical lateral and inf HK, EF 30-35%, Gr 2 DD, mild MR  . Chronic systolic CHF (congestive heart failure) ([place])   . HLD (hyperlipidemia)   . Carotid stenosis     [place] 10/16 - Bilateral 1-39%   Past Surgical History  Procedure Laterality Date  . Percutaneous coronary stent intervention (pci-s)    . Hip fracture surgery      age 34  . Appendectomy       Current Outpatient Prescriptions  Medication Sig Dispense Refill  . aspirin EC 81 MG tablet Take 1 tablet (81 mg total) by mouth daily.    Marland Kitchen atorvastatin (LIPITOR) 80 MG tablet Take 1 tablet (80 mg total) by mouth at bedtime. 30 tablet 9  . clopidogrel (PLAVIX) 75 MG tablet Take 1 tablet (75 mg total) by mouth daily. 90 tablet 2  . metoprolol succinate (TOPROL-XL) 25 MG 24 hr tablet Take 1 tablet (25 mg total) by mouth at bedtime. 30 tablet 9  . NITROSTAT 0.4 MG SL tablet PLACE 1 TAB UNDER TONGUE AT ONSET OF CHEST PAIN; REPEAT EVERY 5 MIN X2 DOSES. IF NO RELIEF, GO TO ER  0   No current facility-administered medications for this visit.    Allergies:   Entresto   Social History:  The patient  reports that  he quit smoking about 13 months ago. His smoking use included Cigarettes. He started smoking about 51 years ago. He has a 62.5 pack-year smoking history. He does not have any smokeless tobacco history on file. He reports that he drinks alcohol. He reports that he does not use illicit drugs.   Family History:  The patient's  family history includes Cancer in his father and mother.    ROS:  Please see the history of present illness.   All other systems are reviewed and negative.    PHYSICAL EXAM: VS:  BP 108/62 mmHg  Pulse 86  Ht 5\' 10"  (1.778 m)  Wt 138 lb (62.596 kg)  BMI 19.80 kg/m2 , BMI Body mass index is 19.8 kg/(m^2). GEN: Well nourished, well developed, in no acute distress HEENT:  normal Neck: no JVD, carotid bruits, or masses Cardiac: RRR; no murmurs, rubs, or gallops,no edema  Respiratory:  clear to auscultation bilaterally, normal work of breathing GI: soft, nontender, nondistended, + BS MS: no deformity or atrophy Skin: warm and dry  Neuro:  Strength and sensation are intact Psych: euthymic mood, full affect  EKG:  EKG is not ordered today. The ekg 4/17 reveals sinus rhythm 61 bpm, PR 156 msec, QRS 94 msec, Qtc 436 msec, inferior TWI   Recent Labs: 09/16/2015: ALT 19; Hemoglobin 13.8; Platelets SEE NOTE 02/19/2016: Brain Natriuretic Peptide 132.3*; BUN 22; Potassium 4.8; Sodium 137 04/30/2016: Creatinine, Ser 1.59*    Lipid Panel     Component Value Date/Time   CHOL 126 09/16/2015 1033   TRIG 65 09/16/2015 1033   HDL 41 09/16/2015 1033   CHOLHDL 3.1 09/16/2015 1033   VLDL 13 09/16/2015 1033   LDLCALC 72 09/16/2015 1033     Wt Readings from Last 3 Encounters:  06/03/16 138 lb (62.596 kg)  04/30/16 142 lb (64.411 kg)  02/27/16 142 lb 12.8 oz (64.774 kg)      Other studies Reviewed: Additional studies/ records that were reviewed today include: Dr York Cerise notes, prior echo, ekgs, and cardiac MRI  Review of the above records today demonstrates: EF 32% by cardiac MRI   ASSESSMENT AND PLAN:  1.  Ischemic CM/ CAD The patient has an ischemic CM (EF 32%), NYHA Class II/III CHF, and CAD. At this time, he meets MADIT II/ SCD-HeFT criteria for ICD implantation for primary prevention of sudden death. He has a narrow QRS and thus does not meet criteria for CRT.  Risks, benefits, alternatives to ICD implantation were discussed in detail with the patient today. The patient  understands that the risks include but are not limited to bleeding, infection, pneumothorax, perforation, tamponade, vascular damage, renal failure, MI, stroke, death, inappropriate shocks, and lead dislodgement and wishes to think about this further.  He is not ready to proceed.  He states  "I have a lot that I have to do this summer but will call you if I decide to proceed, maybe in the fall".  He will follow-up with Dr Burt Knack as scheduled and return to see me as needed. I am happy to proceed with ICD implant should he change his mind.   Current medicines are reviewed at length with the patient today.   The patient does not have concerns regarding his medicines.  The following changes were made today:  none    Signed, Thompson Grayer, MD  06/03/2016 11:06 AM     [facility] [address] [place] [address] [phone] (office) [phone] (fax)

## 2016-06-24 ENCOUNTER — Encounter (INDEPENDENT_AMBULATORY_CARE_PROVIDER_SITE_OTHER): Payer: Self-pay

## 2016-06-24 ENCOUNTER — Ambulatory Visit (INDEPENDENT_AMBULATORY_CARE_PROVIDER_SITE_OTHER): Payer: Medicare Other | Admitting: Cardiovascular Disease

## 2016-06-24 ENCOUNTER — Encounter: Payer: Self-pay | Admitting: Cardiovascular Disease

## 2016-06-24 VITALS — BP 112/66 | HR 61 | Ht 70.0 in | Wt 139.0 lb

## 2016-06-24 DIAGNOSIS — I255 Ischemic cardiomyopathy: Secondary | ICD-10-CM | POA: Diagnosis not present

## 2016-06-24 DIAGNOSIS — R0989 Other specified symptoms and signs involving the circulatory and respiratory systems: Secondary | ICD-10-CM

## 2016-06-24 DIAGNOSIS — I2583 Coronary atherosclerosis due to lipid rich plaque: Secondary | ICD-10-CM

## 2016-06-24 DIAGNOSIS — I2510 Atherosclerotic heart disease of native coronary artery without angina pectoris: Secondary | ICD-10-CM

## 2016-06-24 DIAGNOSIS — E785 Hyperlipidemia, unspecified: Secondary | ICD-10-CM | POA: Diagnosis not present

## 2016-06-24 NOTE — Patient Instructions (Signed)
Medication Instructions:  Your physician recommends that you continue on your current medications as directed. Please refer to the Current Medication list given to you today.  Labwork: Your physician recommends that you return for a FASTING LIPID and CMP in October--nothing to eat or drink after midnight, lab opens at 7:30 AM  Testing/Procedures: Your physician has requested that you have a carotid duplex in OCTOBER. This test is an ultrasound of the carotid arteries in your neck. It looks at blood flow through these arteries that supply the brain with blood. Allow one hour for this exam. There are no restrictions or special instructions.  Follow-Up: Your physician recommends that you schedule a follow-up appointment in: October with Dr Rayann Heman  Your physician wants you to follow-up in: 6 MONTHS with Dr Burt Knack.  You will receive a reminder letter in the mail two months in advance. If you don't receive a letter, please call our office to schedule the follow-up appointment.   Any Other Special Instructions Will Be Listed Below (If Applicable).     If you need a refill on your cardiac medications before your next appointment, please call your pharmacy.

## 2016-06-24 NOTE — Progress Notes (Signed)
Cardiology Office Note Date:  06/24/2016   ID:  Luke Anthony, DOB [birth year], MRN [id number]  PCP:  No PCP Per Patient  Cardiologist:  Sherren Mocha, MD    Chief Complaint  Patient presents with  . Follow-up    CHF    History of Present Illness: Luke Anthony is a 70 y.o. male who presents for follow-up of coronary artery disease and old MI. The patient initially presented with an acute inferior wall MI in September 2016. He was treated with primary PCI using multiple overlapping stents in the right coronary artery. He was also noted to have moderate diffuse left mainstem stenosis estimated at 50% as well as diffuse irregularity of the LAD and left circumflex without significant stenoses the nose vessels. The patient had a follow-up echocardiogram in December 2016 demonstrating severely reduced LV systolic function with an estimated LVEF of 30-35%. Initially his medical therapy was limited because of low blood pressure and he was treated with metoprolol succinate without an ACE or ARB. He was seen in March 2017 and started on Entresto at that time. A cardiac MRI was done for follow-up and showed severe residual LV systolic dysfunction. He was seen by Dr Rayann Heman for consideration of an ICD and decided to defer for the time being.   He had to stop Entresto. He had severe dizziness and shortness of breath with exertion. He feels much better since he's stopped it. He is currently tolerating his activities without exertional symptoms. He does have chronic shortness of breath that is unchanged. No orthopnea or PND. No edema. No chest pain. He remains quit from cigarettes now for greater than 1 year.   Past Medical History:  Diagnosis Date  . CAD (coronary artery disease)    a. Inf MI 9/16 >> BMS x 4 to RCA; residual - oLM 60, p-mLAD 70, o-pD1 70-80, o-pLCx 60-70, EF 45%  //  b. Myoview 10/16 - inf scar, apical septal and apical scar, EF 33% int risk    . Carotid stenosis    [place] 10/16 - Bilateral 1-39%   . Chronic systolic CHF (congestive heart failure) ([place])   . 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 HLD (hyperlipidemia)   . Ischemic cardiomyopathy    a. Echo 12/16 - Inf-lat, inf, apical lateral and inf HK, EF 30-35%, Gr 2 DD, mild MR    Past Surgical History:  Procedure Laterality Date  . APPENDECTOMY    . HIP FRACTURE SURGERY     age 36  . PERCUTANEOUS CORONARY STENT INTERVENTION (PCI-S)      Current Outpatient Prescriptions  Medication Sig Dispense Refill  . aspirin EC 81 MG tablet Take 1 tablet (81 mg total) by mouth daily.    Marland Kitchen atorvastatin (LIPITOR) 80 MG tablet Take 1 tablet (80 mg total) by mouth at bedtime. 30 tablet 9  . clopidogrel (PLAVIX) 75 MG tablet Take 1 tablet (75 mg total) by mouth daily. 90 tablet 2  . metoprolol succinate (TOPROL-XL) 25 MG 24 hr tablet Take 1 tablet (25 mg total) by mouth at bedtime. 30 tablet 9  . NITROSTAT 0.4 MG SL tablet PLACE 1 TAB UNDER TONGUE AT ONSET OF CHEST PAIN; REPEAT EVERY 5 MIN X2 DOSES. IF NO RELIEF, GO TO ER  0   No current facility-administered medications for this visit.     Allergies:   Entresto [sacubitril-valsartan]   Social History:  The patient  reports that he quit smoking about 14 months ago. His smoking use  included Cigarettes. He started smoking about 51 years ago. He has a 62.50 pack-year smoking history. He has never used smokeless tobacco. He reports that he drinks alcohol. He reports that he does not use drugs.   Family History:  The patient's  family history includes Cancer in his father and mother.    ROS:  Please see the history of present illness. All other systems are reviewed and negative.    PHYSICAL EXAM: VS:  BP 112/66   Pulse 61   Ht _0  (1.778 m)   Wt 139 lb (63 kg)   BMI 19.94 kg/m  , BMI Body mass index is 19.94 kg/m. GEN: Well nourished, well developed, in no acute distress  HEENT: normal  Neck: no JVD, no masses. bilateral carotid bruits Cardiac: RRR without murmur or gallop                 Respiratory:  clear to auscultation bilaterally, normal work of breathing GI: soft, nontender, nondistended, + BS MS: no deformity or atrophy  Ext: no pretibial edema Skin: warm and dry, no rash Neuro:  Strength and sensation are intact Psych: euthymic mood, full affect  EKG:  EKG is ordered today.  Recent Labs: 09/16/2015: ALT 19; Hemoglobin 13.8; Platelets SEE NOTE 02/19/2016: Brain Natriuretic Peptide 132.3; BUN 22; Potassium 4.8; Sodium 137 04/30/2016: Creatinine, Ser 1.59   Lipid Panel     Component Value Date/Time   CHOL 126 09/16/2015 1033   TRIG 65 09/16/2015 1033   HDL 41 09/16/2015 1033   CHOLHDL 3.1 09/16/2015 1033   VLDL 13 09/16/2015 1033   LDLCALC 72 09/16/2015 1033      Wt Readings from Last 3 Encounters:  06/24/16 139 lb (63 kg)  06/03/16 138 lb (62.6 kg)  04/30/16 142 lb (64.4 kg)     Cardiac Studies Reviewed: Cardiac MRI June 2017: IMPRESSION: 1) Moderate LVE with diffuse hypokinesis and thinning / akinesis of inferolateral wall EF 32%  2) Full thickness scar in mid and apical inferior lateral wall and subendocardial scar in the inferior septum  ASSESSMENT AND PLAN: 1.  CAD, native vessel, with old MI: no angina. Meds reviewed - no changes recommended.  2. Chronic systolic heart failure, NYHA II. MRI reviewed. Dr Jackalyn Lombard evaluation reviewed. Pt would like to schedule ICD implantation sometime after October when he can tolerate lifting restrictions. He understands rationale for ICD with ischemic disease and LVEF < 35%. He was unable to tolerate Entresto. Continue Toprol XL for now.   3. Carotid stenosis: left CCA stenosis noted. Asymptomatic. Repeat carotid doppler in October. Continue medical therapy.   4. Hyperlipidemia: continue atorvastatin. FU labs in October.  Current medicines are reviewed with the patient today.  The patient does not have concerns regarding medicines.  Labs/ tests ordered today include:   Orders Placed This  Encounter  Procedures  . Comp Met (CMET)  . Lipid panel    Disposition:   FU 6 months. FU Dr Rayann Heman in October  Deatra James, MD  06/24/2016 1:22 PM    [facility] [place], [place], [place]  [address] Phone: [phone]; Fax: [phone]

## 2016-07-07 ENCOUNTER — Other Ambulatory Visit: Payer: Self-pay | Admitting: *Deleted

## 2016-07-07 MED ORDER — CLOPIDOGREL BISULFATE 75 MG PO TABS: 75.0000 mg | ORAL_TABLET | Freq: Every day | ORAL | 11 refills | Status: DC

## 2016-08-14 ENCOUNTER — Encounter: Payer: Self-pay | Admitting: Internal Medicine

## 2016-08-24 DIAGNOSIS — E785 Hyperlipidemia, unspecified: Secondary | ICD-10-CM | POA: Diagnosis not present

## 2016-08-24 DIAGNOSIS — R0602 Shortness of breath: Secondary | ICD-10-CM | POA: Diagnosis not present

## 2016-08-24 DIAGNOSIS — I10 Essential (primary) hypertension: Secondary | ICD-10-CM | POA: Diagnosis not present

## 2016-08-24 DIAGNOSIS — Z79899 Other long term (current) drug therapy: Secondary | ICD-10-CM | POA: Diagnosis not present

## 2016-08-24 DIAGNOSIS — J069 Acute upper respiratory infection, unspecified: Secondary | ICD-10-CM | POA: Diagnosis not present

## 2016-08-24 DIAGNOSIS — Z7982 Long term (current) use of aspirin: Secondary | ICD-10-CM | POA: Diagnosis not present

## 2016-08-24 DIAGNOSIS — Z7902 Long term (current) use of antithrombotics/antiplatelets: Secondary | ICD-10-CM | POA: Diagnosis not present

## 2016-08-24 DIAGNOSIS — I252 Old myocardial infarction: Secondary | ICD-10-CM | POA: Diagnosis not present

## 2016-08-24 DIAGNOSIS — D487 Neoplasm of uncertain behavior of other specified sites: Secondary | ICD-10-CM | POA: Diagnosis not present

## 2016-08-26 DIAGNOSIS — I2510 Atherosclerotic heart disease of native coronary artery without angina pectoris: Secondary | ICD-10-CM | POA: Diagnosis not present

## 2016-08-26 DIAGNOSIS — Z87891 Personal history of nicotine dependence: Secondary | ICD-10-CM | POA: Diagnosis not present

## 2016-08-26 DIAGNOSIS — R945 Abnormal results of liver function studies: Secondary | ICD-10-CM | POA: Diagnosis not present

## 2016-08-26 DIAGNOSIS — E785 Hyperlipidemia, unspecified: Secondary | ICD-10-CM | POA: Diagnosis not present

## 2016-08-26 DIAGNOSIS — R110 Nausea: Secondary | ICD-10-CM | POA: Diagnosis not present

## 2016-08-26 DIAGNOSIS — D649 Anemia, unspecified: Secondary | ICD-10-CM | POA: Diagnosis not present

## 2016-08-26 DIAGNOSIS — I255 Ischemic cardiomyopathy: Secondary | ICD-10-CM | POA: Diagnosis not present

## 2016-08-26 DIAGNOSIS — Z955 Presence of coronary angioplasty implant and graft: Secondary | ICD-10-CM | POA: Diagnosis not present

## 2016-08-26 DIAGNOSIS — Z888 Allergy status to other drugs, medicaments and biological substances status: Secondary | ICD-10-CM | POA: Diagnosis not present

## 2016-08-26 DIAGNOSIS — I252 Old myocardial infarction: Secondary | ICD-10-CM | POA: Diagnosis not present

## 2016-08-26 DIAGNOSIS — R740 Nonspecific elevation of levels of transaminase and lactic acid dehydrogenase [LDH]: Secondary | ICD-10-CM | POA: Diagnosis not present

## 2016-08-26 DIAGNOSIS — R509 Fever, unspecified: Secondary | ICD-10-CM | POA: Diagnosis not present

## 2016-08-26 DIAGNOSIS — Z7902 Long term (current) use of antithrombotics/antiplatelets: Secondary | ICD-10-CM | POA: Diagnosis not present

## 2016-08-26 DIAGNOSIS — N183 Chronic kidney disease, stage 3 (moderate): Secondary | ICD-10-CM | POA: Diagnosis not present

## 2016-08-26 DIAGNOSIS — Z79899 Other long term (current) drug therapy: Secondary | ICD-10-CM | POA: Diagnosis not present

## 2016-08-26 DIAGNOSIS — R918 Other nonspecific abnormal finding of lung field: Secondary | ICD-10-CM | POA: Diagnosis not present

## 2016-08-26 DIAGNOSIS — J449 Chronic obstructive pulmonary disease, unspecified: Secondary | ICD-10-CM | POA: Diagnosis not present

## 2016-08-26 DIAGNOSIS — Z7982 Long term (current) use of aspirin: Secondary | ICD-10-CM | POA: Diagnosis not present

## 2016-08-26 DIAGNOSIS — M2550 Pain in unspecified joint: Secondary | ICD-10-CM | POA: Diagnosis not present

## 2016-08-26 DIAGNOSIS — J432 Centrilobular emphysema: Secondary | ICD-10-CM | POA: Diagnosis not present

## 2016-09-02 ENCOUNTER — Ambulatory Visit: Payer: Medicare Other | Admitting: Internal Medicine

## 2016-09-02 ENCOUNTER — Other Ambulatory Visit: Payer: Medicare Other

## 2016-09-02 ENCOUNTER — Encounter (HOSPITAL_COMMUNITY): Payer: Medicare Other

## 2016-09-03 DIAGNOSIS — M899 Disorder of bone, unspecified: Secondary | ICD-10-CM | POA: Diagnosis not present

## 2016-09-03 DIAGNOSIS — R918 Other nonspecific abnormal finding of lung field: Secondary | ICD-10-CM | POA: Diagnosis not present

## 2016-09-08 DIAGNOSIS — R911 Solitary pulmonary nodule: Secondary | ICD-10-CM | POA: Diagnosis not present

## 2016-09-08 DIAGNOSIS — J984 Other disorders of lung: Secondary | ICD-10-CM | POA: Diagnosis not present

## 2016-09-08 DIAGNOSIS — R918 Other nonspecific abnormal finding of lung field: Secondary | ICD-10-CM | POA: Diagnosis not present

## 2016-09-14 DIAGNOSIS — R52 Pain, unspecified: Secondary | ICD-10-CM | POA: Diagnosis not present

## 2016-09-14 DIAGNOSIS — J449 Chronic obstructive pulmonary disease, unspecified: Secondary | ICD-10-CM | POA: Diagnosis not present

## 2016-09-14 DIAGNOSIS — Z79899 Other long term (current) drug therapy: Secondary | ICD-10-CM | POA: Diagnosis not present

## 2016-09-14 DIAGNOSIS — R918 Other nonspecific abnormal finding of lung field: Secondary | ICD-10-CM | POA: Diagnosis not present

## 2016-09-14 DIAGNOSIS — I714 Abdominal aortic aneurysm, without rupture: Secondary | ICD-10-CM | POA: Diagnosis not present

## 2016-09-14 DIAGNOSIS — E785 Hyperlipidemia, unspecified: Secondary | ICD-10-CM | POA: Diagnosis not present

## 2016-09-14 DIAGNOSIS — B349 Viral infection, unspecified: Secondary | ICD-10-CM | POA: Diagnosis not present

## 2016-09-14 DIAGNOSIS — N183 Chronic kidney disease, stage 3 (moderate): Secondary | ICD-10-CM | POA: Diagnosis not present

## 2016-09-14 DIAGNOSIS — I252 Old myocardial infarction: Secondary | ICD-10-CM | POA: Diagnosis not present

## 2016-09-14 DIAGNOSIS — Z955 Presence of coronary angioplasty implant and graft: Secondary | ICD-10-CM | POA: Diagnosis not present

## 2016-09-14 DIAGNOSIS — D649 Anemia, unspecified: Secondary | ICD-10-CM | POA: Diagnosis not present

## 2016-09-14 DIAGNOSIS — Z888 Allergy status to other drugs, medicaments and biological substances status: Secondary | ICD-10-CM | POA: Diagnosis not present

## 2016-09-14 DIAGNOSIS — Z87891 Personal history of nicotine dependence: Secondary | ICD-10-CM | POA: Diagnosis not present

## 2016-09-14 DIAGNOSIS — Z7902 Long term (current) use of antithrombotics/antiplatelets: Secondary | ICD-10-CM | POA: Diagnosis not present

## 2016-09-14 DIAGNOSIS — K6389 Other specified diseases of intestine: Secondary | ICD-10-CM | POA: Diagnosis not present

## 2016-09-14 DIAGNOSIS — R110 Nausea: Secondary | ICD-10-CM | POA: Diagnosis not present

## 2016-09-14 DIAGNOSIS — J8410 Pulmonary fibrosis, unspecified: Secondary | ICD-10-CM | POA: Diagnosis not present

## 2016-09-14 DIAGNOSIS — R740 Nonspecific elevation of levels of transaminase and lactic acid dehydrogenase [LDH]: Secondary | ICD-10-CM | POA: Diagnosis not present

## 2016-09-14 DIAGNOSIS — R590 Localized enlarged lymph nodes: Secondary | ICD-10-CM | POA: Diagnosis not present

## 2016-09-14 DIAGNOSIS — Z7982 Long term (current) use of aspirin: Secondary | ICD-10-CM | POA: Diagnosis not present

## 2016-09-14 DIAGNOSIS — D518 Other vitamin B12 deficiency anemias: Secondary | ICD-10-CM | POA: Diagnosis not present

## 2016-09-16 DIAGNOSIS — R945 Abnormal results of liver function studies: Secondary | ICD-10-CM | POA: Diagnosis not present

## 2016-09-16 DIAGNOSIS — Z0389 Encounter for observation for other suspected diseases and conditions ruled out: Secondary | ICD-10-CM | POA: Diagnosis not present

## 2016-09-16 DIAGNOSIS — N289 Disorder of kidney and ureter, unspecified: Secondary | ICD-10-CM | POA: Diagnosis not present

## 2016-09-23 DIAGNOSIS — D518 Other vitamin B12 deficiency anemias: Secondary | ICD-10-CM | POA: Diagnosis not present

## 2016-09-25 DIAGNOSIS — R590 Localized enlarged lymph nodes: Secondary | ICD-10-CM | POA: Diagnosis not present

## 2016-09-25 DIAGNOSIS — Z800 Family history of malignant neoplasm of digestive organs: Secondary | ICD-10-CM | POA: Diagnosis not present

## 2016-09-25 DIAGNOSIS — I252 Old myocardial infarction: Secondary | ICD-10-CM | POA: Diagnosis not present

## 2016-09-25 DIAGNOSIS — J432 Centrilobular emphysema: Secondary | ICD-10-CM | POA: Diagnosis not present

## 2016-09-25 DIAGNOSIS — J449 Chronic obstructive pulmonary disease, unspecified: Secondary | ICD-10-CM | POA: Diagnosis not present

## 2016-09-25 DIAGNOSIS — Z7982 Long term (current) use of aspirin: Secondary | ICD-10-CM | POA: Diagnosis not present

## 2016-09-25 DIAGNOSIS — Z7901 Long term (current) use of anticoagulants: Secondary | ICD-10-CM | POA: Diagnosis not present

## 2016-09-25 DIAGNOSIS — I509 Heart failure, unspecified: Secondary | ICD-10-CM | POA: Diagnosis not present

## 2016-09-25 DIAGNOSIS — Z87891 Personal history of nicotine dependence: Secondary | ICD-10-CM | POA: Diagnosis not present

## 2016-09-25 DIAGNOSIS — R918 Other nonspecific abnormal finding of lung field: Secondary | ICD-10-CM | POA: Diagnosis not present

## 2016-09-30 DIAGNOSIS — Z888 Allergy status to other drugs, medicaments and biological substances status: Secondary | ICD-10-CM | POA: Diagnosis not present

## 2016-09-30 DIAGNOSIS — R590 Localized enlarged lymph nodes: Secondary | ICD-10-CM | POA: Diagnosis not present

## 2016-09-30 DIAGNOSIS — J449 Chronic obstructive pulmonary disease, unspecified: Secondary | ICD-10-CM | POA: Diagnosis not present

## 2016-09-30 DIAGNOSIS — Z01818 Encounter for other preprocedural examination: Secondary | ICD-10-CM | POA: Diagnosis not present

## 2016-09-30 DIAGNOSIS — R918 Other nonspecific abnormal finding of lung field: Secondary | ICD-10-CM | POA: Diagnosis not present

## 2016-10-07 DIAGNOSIS — E538 Deficiency of other specified B group vitamins: Secondary | ICD-10-CM | POA: Diagnosis not present

## 2016-11-04 DIAGNOSIS — D518 Other vitamin B12 deficiency anemias: Secondary | ICD-10-CM | POA: Diagnosis not present

## 2016-11-04 DIAGNOSIS — N183 Chronic kidney disease, stage 3 (moderate): Secondary | ICD-10-CM | POA: Diagnosis not present

## 2016-11-04 DIAGNOSIS — R918 Other nonspecific abnormal finding of lung field: Secondary | ICD-10-CM | POA: Diagnosis not present

## 2016-11-04 DIAGNOSIS — Z7902 Long term (current) use of antithrombotics/antiplatelets: Secondary | ICD-10-CM | POA: Diagnosis not present

## 2016-11-04 DIAGNOSIS — E785 Hyperlipidemia, unspecified: Secondary | ICD-10-CM | POA: Diagnosis not present

## 2016-11-04 DIAGNOSIS — J449 Chronic obstructive pulmonary disease, unspecified: Secondary | ICD-10-CM | POA: Diagnosis not present

## 2016-11-04 DIAGNOSIS — R110 Nausea: Secondary | ICD-10-CM | POA: Diagnosis not present

## 2016-11-04 DIAGNOSIS — Z87891 Personal history of nicotine dependence: Secondary | ICD-10-CM | POA: Diagnosis not present

## 2016-11-04 DIAGNOSIS — D649 Anemia, unspecified: Secondary | ICD-10-CM | POA: Diagnosis not present

## 2016-11-04 DIAGNOSIS — Z955 Presence of coronary angioplasty implant and graft: Secondary | ICD-10-CM | POA: Diagnosis not present

## 2016-11-04 DIAGNOSIS — N281 Cyst of kidney, acquired: Secondary | ICD-10-CM | POA: Diagnosis not present

## 2016-11-04 DIAGNOSIS — R911 Solitary pulmonary nodule: Secondary | ICD-10-CM | POA: Diagnosis not present

## 2016-11-04 DIAGNOSIS — Z79899 Other long term (current) drug therapy: Secondary | ICD-10-CM | POA: Diagnosis not present

## 2016-11-04 DIAGNOSIS — I252 Old myocardial infarction: Secondary | ICD-10-CM | POA: Diagnosis not present

## 2016-11-04 DIAGNOSIS — I714 Abdominal aortic aneurysm, without rupture: Secondary | ICD-10-CM | POA: Diagnosis not present

## 2016-11-04 DIAGNOSIS — I2510 Atherosclerotic heart disease of native coronary artery without angina pectoris: Secondary | ICD-10-CM | POA: Diagnosis not present

## 2016-11-04 DIAGNOSIS — R740 Nonspecific elevation of levels of transaminase and lactic acid dehydrogenase [LDH]: Secondary | ICD-10-CM | POA: Diagnosis not present

## 2016-11-04 DIAGNOSIS — Z7982 Long term (current) use of aspirin: Secondary | ICD-10-CM | POA: Diagnosis not present

## 2016-11-04 DIAGNOSIS — Z9889 Other specified postprocedural states: Secondary | ICD-10-CM | POA: Diagnosis not present

## 2016-11-04 DIAGNOSIS — K639 Disease of intestine, unspecified: Secondary | ICD-10-CM | POA: Diagnosis not present

## 2016-11-04 DIAGNOSIS — I255 Ischemic cardiomyopathy: Secondary | ICD-10-CM | POA: Diagnosis not present

## 2016-11-04 DIAGNOSIS — Z888 Allergy status to other drugs, medicaments and biological substances status: Secondary | ICD-10-CM | POA: Diagnosis not present

## 2016-11-04 DIAGNOSIS — M899 Disorder of bone, unspecified: Secondary | ICD-10-CM | POA: Diagnosis not present

## 2016-12-09 ENCOUNTER — Encounter (HOSPITAL_COMMUNITY): Payer: Medicare Other

## 2016-12-21 ENCOUNTER — Other Ambulatory Visit: Payer: Self-pay | Admitting: Cardiovascular Disease

## 2016-12-21 DIAGNOSIS — I6523 Occlusion and stenosis of bilateral carotid arteries: Secondary | ICD-10-CM

## 2016-12-23 ENCOUNTER — Ambulatory Visit (HOSPITAL_COMMUNITY)
Admission: RE | Admit: 2016-12-23 | Discharge: 2016-12-23 | Disposition: A | Discharge status: Home / Self Care | Payer: Medicare Other | Source: Ambulatory Visit | Attending: Cardiovascular Disease | Admitting: Cardiovascular Disease

## 2016-12-23 DIAGNOSIS — I6523 Occlusion and stenosis of bilateral carotid arteries: Secondary | ICD-10-CM

## 2017-02-03 DIAGNOSIS — Z87891 Personal history of nicotine dependence: Secondary | ICD-10-CM | POA: Diagnosis not present

## 2017-02-03 DIAGNOSIS — I2510 Atherosclerotic heart disease of native coronary artery without angina pectoris: Secondary | ICD-10-CM | POA: Diagnosis not present

## 2017-02-03 DIAGNOSIS — N183 Chronic kidney disease, stage 3 (moderate): Secondary | ICD-10-CM | POA: Diagnosis not present

## 2017-02-03 DIAGNOSIS — N281 Cyst of kidney, acquired: Secondary | ICD-10-CM | POA: Diagnosis not present

## 2017-02-03 DIAGNOSIS — R911 Solitary pulmonary nodule: Secondary | ICD-10-CM | POA: Diagnosis not present

## 2017-02-03 DIAGNOSIS — Z79899 Other long term (current) drug therapy: Secondary | ICD-10-CM | POA: Diagnosis not present

## 2017-02-03 DIAGNOSIS — R740 Nonspecific elevation of levels of transaminase and lactic acid dehydrogenase [LDH]: Secondary | ICD-10-CM | POA: Diagnosis not present

## 2017-02-03 DIAGNOSIS — I252 Old myocardial infarction: Secondary | ICD-10-CM | POA: Diagnosis not present

## 2017-02-03 DIAGNOSIS — I714 Abdominal aortic aneurysm, without rupture: Secondary | ICD-10-CM | POA: Diagnosis not present

## 2017-02-03 DIAGNOSIS — R918 Other nonspecific abnormal finding of lung field: Secondary | ICD-10-CM | POA: Diagnosis not present

## 2017-02-03 DIAGNOSIS — Z7982 Long term (current) use of aspirin: Secondary | ICD-10-CM | POA: Diagnosis not present

## 2017-02-03 DIAGNOSIS — E785 Hyperlipidemia, unspecified: Secondary | ICD-10-CM | POA: Diagnosis not present

## 2017-02-03 DIAGNOSIS — R110 Nausea: Secondary | ICD-10-CM | POA: Diagnosis not present

## 2017-02-03 DIAGNOSIS — D649 Anemia, unspecified: Secondary | ICD-10-CM | POA: Diagnosis not present

## 2017-02-03 DIAGNOSIS — M899 Disorder of bone, unspecified: Secondary | ICD-10-CM | POA: Diagnosis not present

## 2017-02-03 DIAGNOSIS — K639 Disease of intestine, unspecified: Secondary | ICD-10-CM | POA: Diagnosis not present

## 2017-02-03 DIAGNOSIS — Z888 Allergy status to other drugs, medicaments and biological substances status: Secondary | ICD-10-CM | POA: Diagnosis not present

## 2017-02-03 DIAGNOSIS — D518 Other vitamin B12 deficiency anemias: Secondary | ICD-10-CM | POA: Diagnosis not present

## 2017-02-03 DIAGNOSIS — N289 Disorder of kidney and ureter, unspecified: Secondary | ICD-10-CM | POA: Diagnosis not present

## 2017-02-03 DIAGNOSIS — J439 Emphysema, unspecified: Secondary | ICD-10-CM | POA: Diagnosis not present

## 2017-03-01 IMAGING — MR MR CARD MORPHOLOGY WO/W CM
10 of 11 series · 15 of 16 positions shown · IV contrast (25    Multihance)
Comparison: none

CLINICAL DATA: Ischemic Cardiomyopathy

EXAM:
CARDIAC MRI
TECHNIQUE: The patient was scanned on a 1.5 Tesla GE magnet. A dedicated
cardiac coil was used. Functional imaging was done using Fiesta
sequences. [DATE], and 4 chamber views were done to assess for RWMA's.
Modified Ksaa rule using a short axis stack was used to
calculate an ejection fraction on a dedicated work station using
Circle software. The patient received 22 cc of Multihance. After 10
minutes inversion recovery sequences were used to assess for
infiltration and scar tissue.
CONTRAST:  22 cc Multihance

[Series 3: bSSFP · sagittal · 8.0mm · 1.25mm/px · 1 of 17 slices shown (1 of 5)]
[im 1/17]
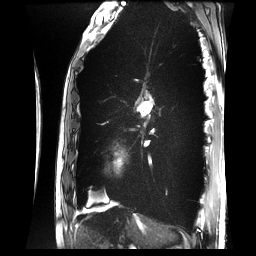

[Series 4: bSSFP · oblique · 8.0mm · 1.25mm/px · 1 of 20 slices shown (2 of 5)]
[im 1/20]
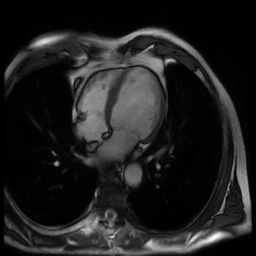

[Series 6: bSSFP · oblique · 8.0mm · 1.25mm/px · 2 of 140 slices shown (3 of 5)]
[im 1/140]
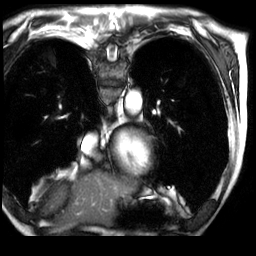
[im 140/140]
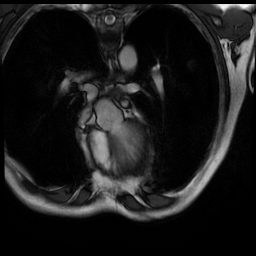

[Series 7: bSSFP · oblique · 8.0mm · 1.37mm/px · 5 of 260 slices shown (4 of 5)]
[im 1/260]
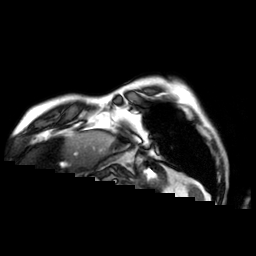
[im 65/260]
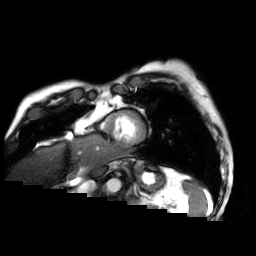
[im 130/260]
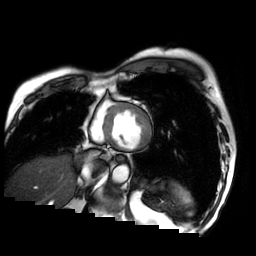
[im 195/260]
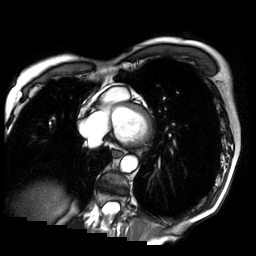
[im 260/260]
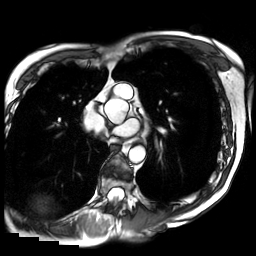

[Series 8: T2 · oblique · 8.0mm · 1.37mm/px · 1 of 60 slices shown (1 of 2)]
[im 1/60]
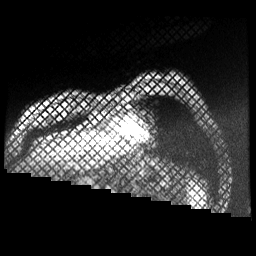

[Series 9: T2 · oblique · 8.0mm · 1.37mm/px · 1 of 60 slices shown (2 of 2)]
[im 1/60]
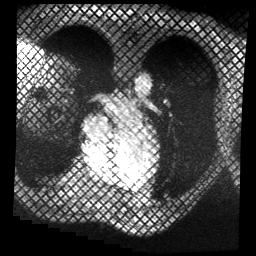

[Series 10: bSSFP · oblique · 8.0mm · 1.37mm/px · 1 of 60 slices shown (5 of 5)]
[im 1/60]
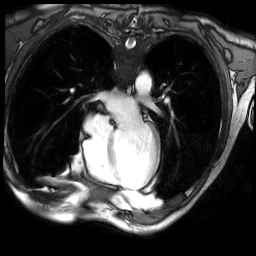

[Series 11: cine ir · oblique · 8.0mm · 1.37mm/px · 1 of 30 slices shown]
[im 1/30]
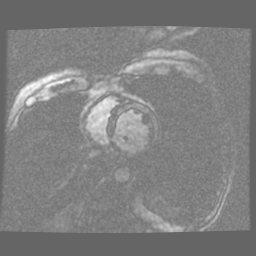

[Series 14: delayed ir prep · oblique · 8.0mm · 1.37mm/px · 1 of 11 slices shown]
[im 1/11]
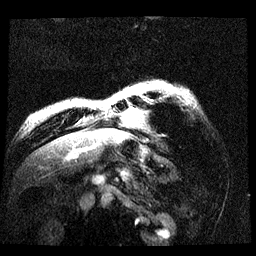

[Series 15: rad delayed ir · oblique · 8.0mm · 1.37mm/px · 1 of 5 slices shown]
[im 1/5]
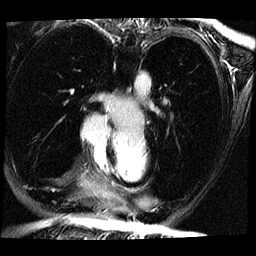

[15 of 16 positions shown; findings below may reference images not displayed]

FINDINGS: There was mild LAE. The RA/RV were normal in size and function.
There was no ASD/VSD. There was no pericardial effusion. The aortic
root was normal. There was moderate LVE. There was diffuse

Hypokinesis worse in the inferior and inferolateral walls. The mid
and apical inferior and inferior lateral walls were thinned and
akinetic. There was no mural apical thrombus

The quantitative EF was 32%. (EDV 130 cc ESV 88 cc SV 42 cc) Delayed
gadolinium enhancement images showed full thickness scar in the
inferior lateral wall at mid and apical level and subendocardial
scar

In the inferior septum
IMPRESSION: 1) Moderate LVE with diffuse hypokinesis and thinning / akinesis of
inferolateral wall EF 32%

2) Full thickness scar in mid and apical inferior lateral wall and
subendocardial scar in the inferior septum

Raziyya Mizanr

## 2017-03-03 ENCOUNTER — Other Ambulatory Visit: Payer: Self-pay

## 2017-03-03 MED ORDER — ATORVASTATIN CALCIUM 80 MG PO TABS: 80.0000 mg | ORAL_TABLET | Freq: Every day | ORAL | 9 refills | Status: DC

## 2017-03-04 ENCOUNTER — Other Ambulatory Visit: Payer: Self-pay | Admitting: Cardiovascular Disease

## 2017-03-24 DIAGNOSIS — R0602 Shortness of breath: Secondary | ICD-10-CM | POA: Diagnosis not present

## 2017-03-24 DIAGNOSIS — J441 Chronic obstructive pulmonary disease with (acute) exacerbation: Secondary | ICD-10-CM | POA: Diagnosis not present

## 2017-03-24 DIAGNOSIS — Z720 Tobacco use: Secondary | ICD-10-CM | POA: Diagnosis not present

## 2017-03-24 DIAGNOSIS — I10 Essential (primary) hypertension: Secondary | ICD-10-CM | POA: Diagnosis not present

## 2017-03-24 DIAGNOSIS — I2510 Atherosclerotic heart disease of native coronary artery without angina pectoris: Secondary | ICD-10-CM | POA: Diagnosis not present

## 2017-03-25 DIAGNOSIS — J441 Chronic obstructive pulmonary disease with (acute) exacerbation: Secondary | ICD-10-CM | POA: Diagnosis not present

## 2017-03-25 DIAGNOSIS — E860 Dehydration: Secondary | ICD-10-CM | POA: Diagnosis not present

## 2017-03-25 DIAGNOSIS — D696 Thrombocytopenia, unspecified: Secondary | ICD-10-CM | POA: Diagnosis not present

## 2017-03-26 DIAGNOSIS — D696 Thrombocytopenia, unspecified: Secondary | ICD-10-CM | POA: Diagnosis not present

## 2017-03-26 DIAGNOSIS — E860 Dehydration: Secondary | ICD-10-CM | POA: Diagnosis not present

## 2017-03-26 DIAGNOSIS — J441 Chronic obstructive pulmonary disease with (acute) exacerbation: Secondary | ICD-10-CM | POA: Diagnosis not present

## 2017-03-27 DIAGNOSIS — D696 Thrombocytopenia, unspecified: Secondary | ICD-10-CM | POA: Diagnosis not present

## 2017-03-27 DIAGNOSIS — J441 Chronic obstructive pulmonary disease with (acute) exacerbation: Secondary | ICD-10-CM | POA: Diagnosis not present

## 2017-03-27 DIAGNOSIS — E860 Dehydration: Secondary | ICD-10-CM | POA: Diagnosis not present

## 2017-03-28 DIAGNOSIS — J441 Chronic obstructive pulmonary disease with (acute) exacerbation: Secondary | ICD-10-CM | POA: Diagnosis not present

## 2017-03-28 DIAGNOSIS — E860 Dehydration: Secondary | ICD-10-CM | POA: Diagnosis not present

## 2017-03-28 DIAGNOSIS — D696 Thrombocytopenia, unspecified: Secondary | ICD-10-CM | POA: Diagnosis not present

## 2017-03-29 DIAGNOSIS — J441 Chronic obstructive pulmonary disease with (acute) exacerbation: Secondary | ICD-10-CM | POA: Diagnosis not present

## 2017-03-29 DIAGNOSIS — E860 Dehydration: Secondary | ICD-10-CM | POA: Diagnosis not present

## 2017-03-29 DIAGNOSIS — D696 Thrombocytopenia, unspecified: Secondary | ICD-10-CM | POA: Diagnosis not present

## 2017-03-31 DIAGNOSIS — J189 Pneumonia, unspecified organism: Secondary | ICD-10-CM | POA: Diagnosis not present

## 2017-03-31 DIAGNOSIS — K56609 Unspecified intestinal obstruction, unspecified as to partial versus complete obstruction: Secondary | ICD-10-CM | POA: Diagnosis not present

## 2017-03-31 DIAGNOSIS — Z955 Presence of coronary angioplasty implant and graft: Secondary | ICD-10-CM | POA: Diagnosis not present

## 2017-03-31 DIAGNOSIS — D696 Thrombocytopenia, unspecified: Secondary | ICD-10-CM | POA: Diagnosis not present

## 2017-03-31 DIAGNOSIS — I10 Essential (primary) hypertension: Secondary | ICD-10-CM | POA: Diagnosis not present

## 2017-03-31 DIAGNOSIS — Z87891 Personal history of nicotine dependence: Secondary | ICD-10-CM | POA: Diagnosis not present

## 2017-03-31 DIAGNOSIS — Z681 Body mass index (BMI) 19 or less, adult: Secondary | ICD-10-CM | POA: Diagnosis not present

## 2017-03-31 DIAGNOSIS — K5900 Constipation, unspecified: Secondary | ICD-10-CM | POA: Diagnosis not present

## 2017-03-31 DIAGNOSIS — R112 Nausea with vomiting, unspecified: Secondary | ICD-10-CM | POA: Diagnosis not present

## 2017-03-31 DIAGNOSIS — K56600 Partial intestinal obstruction, unspecified as to cause: Secondary | ICD-10-CM | POA: Diagnosis not present

## 2017-03-31 DIAGNOSIS — Z79899 Other long term (current) drug therapy: Secondary | ICD-10-CM | POA: Diagnosis not present

## 2017-03-31 DIAGNOSIS — J441 Chronic obstructive pulmonary disease with (acute) exacerbation: Secondary | ICD-10-CM | POA: Diagnosis not present

## 2017-03-31 DIAGNOSIS — E860 Dehydration: Secondary | ICD-10-CM | POA: Diagnosis not present

## 2017-03-31 DIAGNOSIS — R1084 Generalized abdominal pain: Secondary | ICD-10-CM | POA: Diagnosis not present

## 2017-03-31 DIAGNOSIS — I252 Old myocardial infarction: Secondary | ICD-10-CM | POA: Diagnosis not present

## 2017-03-31 DIAGNOSIS — E785 Hyperlipidemia, unspecified: Secondary | ICD-10-CM | POA: Diagnosis present

## 2017-03-31 DIAGNOSIS — K56699 Other intestinal obstruction unspecified as to partial versus complete obstruction: Secondary | ICD-10-CM | POA: Diagnosis present

## 2017-03-31 DIAGNOSIS — R109 Unspecified abdominal pain: Secondary | ICD-10-CM | POA: Diagnosis not present

## 2017-03-31 DIAGNOSIS — J439 Emphysema, unspecified: Secondary | ICD-10-CM | POA: Diagnosis not present

## 2017-04-12 DIAGNOSIS — K5669 Other partial intestinal obstruction: Secondary | ICD-10-CM | POA: Diagnosis not present

## 2017-04-21 ENCOUNTER — Encounter: Payer: Self-pay | Admitting: Cardiovascular Disease

## 2017-04-28 DIAGNOSIS — K56609 Unspecified intestinal obstruction, unspecified as to partial versus complete obstruction: Secondary | ICD-10-CM | POA: Diagnosis not present

## 2017-05-12 ENCOUNTER — Ambulatory Visit (INDEPENDENT_AMBULATORY_CARE_PROVIDER_SITE_OTHER): Payer: Medicare Other | Admitting: Cardiovascular Disease

## 2017-05-12 ENCOUNTER — Encounter: Payer: Self-pay | Admitting: Cardiovascular Disease

## 2017-05-12 VITALS — BP 102/54 | HR 92 | Ht 70.0 in | Wt 133.0 lb

## 2017-05-12 DIAGNOSIS — I2589 Other forms of chronic ischemic heart disease: Secondary | ICD-10-CM

## 2017-05-12 DIAGNOSIS — I255 Ischemic cardiomyopathy: Secondary | ICD-10-CM | POA: Diagnosis not present

## 2017-05-12 DIAGNOSIS — I5022 Chronic systolic (congestive) heart failure: Secondary | ICD-10-CM | POA: Diagnosis not present

## 2017-05-12 NOTE — Patient Instructions (Addendum)
Medication Instructions:  Your physician recommends that you continue on your current medications as directed. Please refer to the Current Medication list given to you today.  Labwork: No new orders.   Testing/Procedures: Your physician has requested that you have an echocardiogram. Echocardiography is a painless test that uses sound waves to create images of your heart. It provides your doctor with information about the size and shape of your heart and how well your heart's chambers and valves are working. This procedure takes approximately one hour. There are no restrictions for this procedure.  Follow-Up: Your physician wants you to follow-up in: 6 MONTHS with Dr Burt Knack.  You will receive a reminder letter in the mail two months in advance. If you don't receive a letter, please call our office to schedule the follow-up appointment.   Any Other Special Instructions Will Be Listed Below (If Applicable).  Patient will be due for renal artery duplex and carotid duplex in January 2019.    If you need a refill on your cardiac medications before your next appointment, please call your pharmacy.

## 2017-05-12 NOTE — Progress Notes (Signed)
Cardiology Office Note Date:  05/12/2017   ID:  Luke Anthony, DOB Apr 21, 1946, MRN [id number]  PCP:  Patient, No Pcp Per  Cardiologist:  Sherren Mocha, MD    Chief Complaint  Patient presents with  . Follow-up     History of Present Illness: Luke Anthony is a 71 y.o. male who presents for Follow-up of coronary artery disease and chronic systolic heart failure. The patient initially presented with an acute inferior wall MI in 2016 treated with primary PCI with multiple overlapping stents in the right coronary artery. At that time he was noted to have diffuse left mainstem stenosis of 50% and diffuse irregularity in the LAD and left circumflex vessels. LVEF was in the range of 30-35%. The patient had difficulty tolerating post MI medical therapy because of symptomatic hypotension. He could not tolerate an ACE or ARB. He had severe residual LV dysfunction and was referred to separation of an ICD for primary prevention.  The patient complains of shortness of breath. He is a long-standing smoker and quit at the time of his MI in 2016. He denies edema, orthopnea, PND, or chest pain. He's had no lightheadedness, heart palpitations, or syncope. He's been treated at [facility] and has undergone extensive evaluation for lung mass. All of his biopsies were negative. He is interested in pursuing ICD implantation if still indicated. He is still active and involved in his work, traveling frequently.   Past Medical History:  Diagnosis Date  . CAD (coronary artery disease)    a. Inf MI 9/16 >> BMS x 4 to RCA; residual - oLM 60, p-mLAD 70, o-pD1 70-80, o-pLCx 60-70, EF 45%  //  b. Myoview 10/16 - inf scar, apical septal and apical scar, EF 33% int risk    . Carotid stenosis    [place] 10/16 - Bilateral 1-39%  . Chronic systolic CHF (congestive heart failure) ([place])   . 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 HLD (hyperlipidemia)   . Ischemic cardiomyopathy    a. Echo 12/16 - Inf-lat, inf, apical  lateral and inf HK, EF 30-35%, Gr 2 DD, mild MR    Past Surgical History:  Procedure Laterality Date  . APPENDECTOMY    . HIP FRACTURE SURGERY     age 28  . PERCUTANEOUS CORONARY STENT INTERVENTION (PCI-S)      Current Outpatient Prescriptions  Medication Sig Dispense Refill  . aspirin EC 81 MG tablet Take 1 tablet (81 mg total) by mouth daily.    Marland Kitchen atorvastatin (LIPITOR) 80 MG tablet Take 1 tablet (80 mg total) by mouth at bedtime. 30 tablet 9  . clopidogrel (PLAVIX) 75 MG tablet Take 1 tablet (75 mg total) by mouth daily. 30 tablet 11  . metoprolol succinate (TOPROL-XL) 25 MG 24 hr tablet Take 1 tablet (25 mg total) by mouth at bedtime. 30 tablet 3  . NITROSTAT 0.4 MG SL tablet PLACE 1 TAB UNDER TONGUE AT ONSET OF CHEST PAIN; REPEAT EVERY 5 MIN X2 DOSES. IF NO RELIEF, GO TO ER  0   No current facility-administered medications for this visit.     Allergies:   Entresto [sacubitril-valsartan]   Social History:  The patient  reports that he quit smoking about 2 years ago. His smoking use included Cigarettes. He started smoking about 52 years ago. He has a 62.50 pack-year smoking history. He has never used smokeless tobacco. He reports that he drinks alcohol. He reports that he does not use drugs.   Family History:  The patient's  family history includes Cancer in his father and mother.    ROS:  Please see the history of present illness.  All other systems are reviewed and negative.    PHYSICAL EXAM: VS:  BP (!) 102/54   Pulse 92   Ht 5\' 10"  (1.778 m)   Wt 133 lb (60.3 kg)   BMI 19.08 kg/m  , BMI Body mass index is 19.08 kg/m. GEN: thin male, in no acute distress  HEENT: normal  Neck: no JVD, no masses. bilateral carotid bruits Cardiac: RRR without murmur or gallop with distant heart sounds       Respiratory:  clear to auscultation bilaterally, normal work of breathing GI: soft, nontender, nondistended, + BS MS: no deformity or atrophy  Ext: no pretibial edema Skin: warm  and dry, no rash Neuro:  Strength and sensation are intact Psych: euthymic mood, full affect  EKG:  EKG is ordered today. The ekg ordered today shows normal sinus rhythm 89 bpm, ST and T-wave abnormality consider inferior and lateral ischemia  Recent Labs: No results found for requested labs within last 8760 hours.   Lipid Panel     Component Value Date/Time   CHOL 126 09/16/2015 1033   TRIG 65 09/16/2015 1033   HDL 41 09/16/2015 1033   CHOLHDL 3.1 09/16/2015 1033   VLDL 13 09/16/2015 1033   LDLCALC 72 09/16/2015 1033      Wt Readings from Last 3 Encounters:  05/12/17 133 lb (60.3 kg)  06/24/16 139 lb (63 kg)  06/03/16 138 lb (62.6 kg)     Cardiac Studies Reviewed: Cardiac MRI 04/30/16: IMPRESSION: 1) Moderate LVE with diffuse hypokinesis and thinning / akinesis of inferolateral wall EF 32%  2) Full thickness scar in mid and apical inferior lateral wall and subendocardial scar in the inferior septum  Echo 11-06-15: IMPRESSION: Study Conclusions  - Left ventricle: There is hypokinesis of the basal and mid   inferolateral, inferior and apical lateral and inferior walls.   The cavity size was normal. Systolic function was moderately to   severely reduced. The estimated ejection fraction was in the   range of 30% to 35%. Features are consistent with a pseudonormal   left ventricular filling pattern, with concomitant abnormal   relaxation and increased filling pressure (grade 2 diastolic   dysfunction). Doppler parameters are consistent with elevated   ventricular end-diastolic filling pressure. - Aortic valve: Trileaflet; normal thickness leaflets.   Transvalvular velocity was within the normal range. There was no   stenosis. There was no regurgitation. - Aortic root: The aortic root was normal in size. - Ascending aorta: The ascending aorta was normal in size. - Mitral valve: Structurally normal valve. There was mild   regurgitation. - Right ventricle: The cavity  size was normal. Wall thickness was   normal. Systolic function was normal. - Right atrium: The atrium was normal in size. - Tricuspid valve: There was trivial regurgitation. - Pulmonic valve: There was no regurgitation. - Pulmonary arteries: Systolic pressure was within the normal   range. - Inferior vena cava: The vessel was normal in size. - Pericardium, extracardiac: There was no pericardial effusion.  ASSESSMENT AND PLAN: 1.  CAD, native vessel, without angina: The patient will continue on aspirin and clopidogrel. I think it is best to leave him on long-term dual antiplatelet therapy since he was treated with multiple overlapping stents in the setting of an acute STEMI. He could interrupt Plavix for procedures in the future without high  risk.  2. Chronic systolic heart failure, NYHA functional class IIB: The patient appears euvolemic on exam. Will repeat his echocardiogram as it has been one year since last assessment of LV function. Will consider referring him back to Dr. Rayann Heman if his LVEF remains less than 35%. Unfortunately medical therapy has been limited by symptomatic hypotension and he remains only on metoprolol succinate. Even with this his blood pressure is still in the low-normal range.  3. Hyperlipidemia: Treated with atorvastatin 80 mg.  4. Abdominal aortic aneurysm: Small AAA noted on imaging studies. Maximum dimensions less than 3.5 cm. Continue yearly ultrasound surveillance.  5. Carotid stenosis without stroke: Carotid duplex from January 2018 is reviewed with moderate carotid stenosis. Will arrange follow-up in one year to be done at the same time as his AAA duplex scan.  Current medicines are reviewed with the patient today.  The patient does not have concerns regarding medicines.  Labs/ tests ordered today include:   Orders Placed This Encounter  Procedures  . EKG 12-Lead  . ECHOCARDIOGRAM COMPLETE    Disposition:   FU 6 months. Will call after echo is  completed to consider EP re-evaluation with Dr Rayann Heman.  Deatra James, MD  05/12/2017 5:57 PM    [address], [place], [place]  [address] Phone: [phone]; Fax: [phone]

## 2017-05-19 DIAGNOSIS — K5669 Other partial intestinal obstruction: Secondary | ICD-10-CM | POA: Diagnosis not present

## 2017-06-02 ENCOUNTER — Other Ambulatory Visit: Payer: Self-pay

## 2017-06-02 ENCOUNTER — Ambulatory Visit (HOSPITAL_COMMUNITY): Payer: Medicare Other | Attending: Cardiovascular Disease

## 2017-06-02 DIAGNOSIS — I5022 Chronic systolic (congestive) heart failure: Secondary | ICD-10-CM

## 2017-06-02 DIAGNOSIS — I081 Rheumatic disorders of both mitral and tricuspid valves: Secondary | ICD-10-CM | POA: Insufficient documentation

## 2017-06-02 DIAGNOSIS — I219 Acute myocardial infarction, unspecified: Secondary | ICD-10-CM | POA: Insufficient documentation

## 2017-06-02 DIAGNOSIS — I255 Ischemic cardiomyopathy: Secondary | ICD-10-CM

## 2017-06-02 DIAGNOSIS — E785 Hyperlipidemia, unspecified: Secondary | ICD-10-CM | POA: Insufficient documentation

## 2017-06-02 DIAGNOSIS — I2510 Atherosclerotic heart disease of native coronary artery without angina pectoris: Secondary | ICD-10-CM | POA: Insufficient documentation

## 2017-06-04 ENCOUNTER — Other Ambulatory Visit: Payer: Self-pay

## 2017-06-04 MED ORDER — LOSARTAN POTASSIUM 25 MG PO TABS: 25.0000 mg | ORAL_TABLET | Freq: Every day | ORAL | 6 refills | Status: DC

## 2017-07-19 ENCOUNTER — Other Ambulatory Visit: Payer: Self-pay | Admitting: *Deleted

## 2017-07-19 MED ORDER — CLOPIDOGREL BISULFATE 75 MG PO TABS: 75.0000 mg | ORAL_TABLET | Freq: Every day | ORAL | 9 refills | Status: DC

## 2017-08-03 ENCOUNTER — Other Ambulatory Visit: Payer: Self-pay | Admitting: Cardiovascular Disease

## 2017-08-09 ENCOUNTER — Other Ambulatory Visit: Payer: Self-pay | Admitting: Cardiovascular Disease

## 2017-08-09 DIAGNOSIS — I714 Abdominal aortic aneurysm, without rupture, unspecified: Secondary | ICD-10-CM

## 2017-09-08 ENCOUNTER — Ambulatory Visit (HOSPITAL_COMMUNITY)
Admission: RE | Admit: 2017-09-08 | Discharge: 2017-09-08 | Disposition: A | Discharge status: Home / Self Care | Payer: Medicare Other | Source: Ambulatory Visit | Attending: Cardiovascular Disease | Admitting: Cardiovascular Disease

## 2017-09-08 DIAGNOSIS — I714 Abdominal aortic aneurysm, without rupture, unspecified: Secondary | ICD-10-CM

## 2017-09-08 DIAGNOSIS — Z87891 Personal history of nicotine dependence: Secondary | ICD-10-CM | POA: Insufficient documentation

## 2017-09-08 DIAGNOSIS — I2510 Atherosclerotic heart disease of native coronary artery without angina pectoris: Secondary | ICD-10-CM | POA: Diagnosis not present

## 2017-09-08 DIAGNOSIS — E785 Hyperlipidemia, unspecified: Secondary | ICD-10-CM | POA: Insufficient documentation

## 2017-09-28 ENCOUNTER — Telehealth: Payer: Self-pay

## 2017-09-28 DIAGNOSIS — I714 Abdominal aortic aneurysm, without rupture, unspecified: Secondary | ICD-10-CM

## 2017-09-28 NOTE — Telephone Encounter (Signed)
-----   Message from Sherren Mocha, MD sent at 09/27/2017  7:07 AM EST ----- Small AAA. One year FU recommended.

## 2017-09-28 NOTE — Telephone Encounter (Signed)
Informed patient of results and verbal understanding expressed.   Repeat AAA duplex ordered to be scheduled in 1 year. Patient agrees with treatment plan.

## 2017-11-03 DIAGNOSIS — I209 Angina pectoris, unspecified: Secondary | ICD-10-CM | POA: Diagnosis not present

## 2017-11-03 DIAGNOSIS — J219 Acute bronchiolitis, unspecified: Secondary | ICD-10-CM | POA: Diagnosis not present

## 2017-11-03 DIAGNOSIS — R062 Wheezing: Secondary | ICD-10-CM | POA: Diagnosis not present

## 2017-11-03 DIAGNOSIS — Z23 Encounter for immunization: Secondary | ICD-10-CM | POA: Diagnosis not present

## 2017-11-03 DIAGNOSIS — I10 Essential (primary) hypertension: Secondary | ICD-10-CM | POA: Diagnosis not present

## 2017-11-30 DIAGNOSIS — Z1212 Encounter for screening for malignant neoplasm of rectum: Secondary | ICD-10-CM | POA: Diagnosis not present

## 2017-11-30 DIAGNOSIS — Z1211 Encounter for screening for malignant neoplasm of colon: Secondary | ICD-10-CM | POA: Diagnosis not present

## 2017-12-07 ENCOUNTER — Other Ambulatory Visit: Payer: Self-pay

## 2017-12-07 MED ORDER — ATORVASTATIN CALCIUM 80 MG PO TABS: 80.0000 mg | ORAL_TABLET | Freq: Every day | ORAL | 6 refills | Status: DC

## 2017-12-07 MED ORDER — LOSARTAN POTASSIUM 25 MG PO TABS: 25.0000 mg | ORAL_TABLET | Freq: Every day | ORAL | 6 refills | Status: DC

## 2017-12-08 DIAGNOSIS — E783 Hyperchylomicronemia: Secondary | ICD-10-CM | POA: Diagnosis not present

## 2017-12-08 DIAGNOSIS — I10 Essential (primary) hypertension: Secondary | ICD-10-CM | POA: Diagnosis not present

## 2017-12-08 DIAGNOSIS — J440 Chronic obstructive pulmonary disease with acute lower respiratory infection: Secondary | ICD-10-CM | POA: Diagnosis not present

## 2017-12-08 DIAGNOSIS — Z23 Encounter for immunization: Secondary | ICD-10-CM | POA: Diagnosis not present

## 2017-12-22 ENCOUNTER — Encounter (INDEPENDENT_AMBULATORY_CARE_PROVIDER_SITE_OTHER): Payer: Self-pay

## 2017-12-22 ENCOUNTER — Encounter: Payer: Self-pay | Admitting: Cardiovascular Disease

## 2017-12-22 ENCOUNTER — Ambulatory Visit (INDEPENDENT_AMBULATORY_CARE_PROVIDER_SITE_OTHER): Payer: Medicare Other | Admitting: Cardiovascular Disease

## 2017-12-22 VITALS — BP 136/70 | HR 88 | Ht 70.0 in | Wt 142.4 lb

## 2017-12-22 DIAGNOSIS — I10 Essential (primary) hypertension: Secondary | ICD-10-CM | POA: Diagnosis not present

## 2017-12-22 DIAGNOSIS — I5022 Chronic systolic (congestive) heart failure: Secondary | ICD-10-CM | POA: Diagnosis not present

## 2017-12-22 DIAGNOSIS — I2510 Atherosclerotic heart disease of native coronary artery without angina pectoris: Secondary | ICD-10-CM

## 2017-12-22 DIAGNOSIS — R062 Wheezing: Secondary | ICD-10-CM | POA: Diagnosis not present

## 2017-12-22 DIAGNOSIS — E785 Hyperlipidemia, unspecified: Secondary | ICD-10-CM | POA: Diagnosis not present

## 2017-12-22 DIAGNOSIS — J440 Chronic obstructive pulmonary disease with acute lower respiratory infection: Secondary | ICD-10-CM | POA: Diagnosis not present

## 2017-12-22 LAB — BASIC METABOLIC PANEL
BUN / CREAT RATIO: 23 (ref 10–24)
BUN: 28 mg/dL — ABNORMAL HIGH (ref 8–27)
CHLORIDE: 103 mmol/L (ref 96–106)
CO2: 21 mmol/L (ref 20–29)
CREATININE: 1.23 mg/dL (ref 0.76–1.27)
Calcium: 9.1 mg/dL (ref 8.6–10.2)
GFR calc Af Amer: 68 mL/min/{1.73_m2} (ref >59)
GFR calc non Af Amer: 59 mL/min/{1.73_m2} — ABNORMAL LOW (ref >59)
GLUCOSE: 100 mg/dL — AB (ref 65–99)
Potassium: 4.7 mmol/L (ref 3.5–5.2)
SODIUM: 140 mmol/L (ref 134–144)

## 2017-12-22 LAB — PRO B NATRIURETIC PEPTIDE: NT-PRO BNP: 881 pg/mL — AB (ref 0–376)

## 2017-12-22 MED ORDER — SPIRONOLACTONE 25 MG PO TABS: 25.0000 mg | ORAL_TABLET | Freq: Every day | ORAL | 11 refills | Status: AC

## 2017-12-22 NOTE — Progress Notes (Signed)
Cardiology Office Note Date:  12/22/2017   ID:  Luke Anthony, DOB 1946/07/15, MRN [id number]  PCP:  Algernon Huxley, MD  Cardiologist:  Sherren Mocha, MD    Chief Complaint  Patient presents with  . Follow-up  . Shortness of Breath     History of Present Illness: Luke Anthony is a 72 y.o. male who presents for follow-up evaluation.  He is followed for coronary artery disease with history of large inferior MI and severe residual LV systolic dysfunction, now with chronic systolic heart failure.  He has not been interested in pursuing EP evaluation for ICD implantation.  He is here alone today. Primary complaint is shortness of breath. This occurs with minimal activity such as walking a short distance on flat ground. Going up steps causes dyspnea. No orthopnea, PND, or edema. No palpitations, lightheadedness, or syncope. He drinks a lot of caffeine during the day, especially in the cold weather. 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 Doesn't drink caffeine after 3pm.    Past Medical History:  Diagnosis Date  . CAD (coronary artery disease)    a. Inf MI 9/16 >> BMS x 4 to RCA; residual - oLM 60, p-mLAD 70, o-pD1 70-80, o-pLCx 60-70, EF 45%  //  b. Myoview 10/16 - inf scar, apical septal and apical scar, EF 33% int risk    . Carotid stenosis    [place] 10/16 - Bilateral 1-39%  . Chronic systolic CHF (congestive heart failure) ([place])   . 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 HLD (hyperlipidemia)   . Ischemic cardiomyopathy    a. Echo 12/16 - Inf-lat, inf, apical lateral and inf HK, EF 30-35%, Gr 2 DD, mild MR    Past Surgical History:  Procedure Laterality Date  . APPENDECTOMY    . HIP FRACTURE SURGERY     age 67  . PERCUTANEOUS CORONARY STENT INTERVENTION (PCI-S)      Current Outpatient Medications  Medication Sig Dispense Refill  . aspirin EC 81 MG tablet Take 1 tablet (81 mg total) by mouth daily.    Marland Kitchen atorvastatin (LIPITOR) 80 MG tablet Take  1 tablet (80 mg total) by mouth at bedtime. 30 tablet 6  . clopidogrel (PLAVIX) 75 MG tablet Take 1 tablet (75 mg total) by mouth daily. 30 tablet 9  . losartan (COZAAR) 25 MG tablet Take 1 tablet (25 mg total) by mouth daily. 30 tablet 6  . metoprolol succinate (TOPROL-XL) 25 MG 24 hr tablet TAKE 1 TABLET (25 MG TOTAL) BY MOUTH AT BEDTIME. 30 tablet 8  . NITROSTAT 0.4 MG SL tablet PLACE 1 TAB UNDER TONGUE AT ONSET OF CHEST PAIN; REPEAT EVERY 5 MIN X2 DOSES. IF NO RELIEF, GO TO ER  0  . VENTOLIN HFA 108 (90 Base) MCG/ACT inhaler Inhale 2 puffs into the lungs every 4 (four) hours as needed for wheezing or shortness of breath.  2   No current facility-administered medications for this visit.     Allergies:   Entresto [sacubitril-valsartan]   Social History:  The patient  reports that he quit smoking about 2 years ago. His smoking use included cigarettes. He started smoking about 53 years ago. He has a 62.50 pack-year smoking history. he has never used smokeless tobacco. He reports that he drinks alcohol. He reports that he does not use drugs.   Family History:  The patient's family history includes Cancer in his father and mother.    ROS:  Please see the history of present illness.  All other systems are reviewed and negative.    PHYSICAL EXAM: VS:  BP 136/70   Pulse 88   Ht 5\' 10"  (1.778 m)   Wt 142 lb 6.4 oz (64.6 kg)   BMI 20.43 kg/m  , BMI Body mass index is 20.43 kg/m. GEN: pleasant, thin gentleman, in no acute distress  HEENT: normal  Neck: no JVD, no masses. No carotid bruits Cardiac: RRR without murmur or gallop     Respiratory:  clear to auscultation bilaterally, normal work of breathing GI: soft, nontender, nondistended, + BS MS: no deformity or atrophy  Ext: no pretibial edema Skin: warm and dry, no rash Neuro:  Strength and sensation are intact Psych: euthymic mood, full affect  EKG:  EKG is ordered today. The ekg ordered today shows normal sinus rhythm 88 bpm, sinus  arrhythmia, T wave abnormality consider inferior ischemia  Recent Labs: No results found for requested labs within last 8760 hours.   Lipid Panel     Component Value Date/Time   CHOL 126 09/16/2015 1033   TRIG 65 09/16/2015 1033   HDL 41 09/16/2015 1033   CHOLHDL 3.1 09/16/2015 1033   VLDL 13 09/16/2015 1033   LDLCALC 72 09/16/2015 1033      Wt Readings from Last 3 Encounters:  12/22/17 142 lb 6.4 oz (64.6 kg)  05/12/17 133 lb (60.3 kg)  06/24/16 139 lb (63 kg)     Cardiac Studies Reviewed: 2D echocardiogram June 02, 2017: Study Conclusions  - Left ventricle: The cavity size was normal. Systolic function was   severely reduced. The estimated ejection fraction was in the   range of 20% to 25%. Diffuse hypokinesis. Features are consistent   with a pseudonormal left ventricular filling pattern, with   concomitant abnormal relaxation and increased filling pressure   (grade 2 diastolic dysfunction). Doppler parameters are   consistent with indeterminate ventricular filling pressure. - Aortic valve: Transvalvular velocity was within the normal range.   There was no stenosis. There was no regurgitation. - Mitral valve: Transvalvular velocity was within the normal range.   There was no evidence for stenosis. There was mild regurgitation. - Right ventricle: The cavity size was normal. Wall thickness was   normal. Systolic function was mildly reduced. - Atrial septum: No defect or patent foramen ovale was identified   by color flow Doppler. - Tricuspid valve: There was trivial regurgitation.  ASSESSMENT AND PLAN: 1.  Chronic systolic heart failure, New York Heart Association functional class II symptoms.  The patient continues on a program of losartan and metoprolol succinate.  He was tried on Entresto and could not tolerate it.  I am going to add Aldactone at low-dose to his regimen.  He is not interested in situation of an ICD.  We have previously discussed this at length.  I  will see him back in 6 months.  We will draw a baseline metabolic panel as we are starting Spironolactone.  2.  Coronary artery disease, native vessel, without angina.  Patient continues on aspirin for antiplatelet therapy and a high intensity statin drug.  3.  Hyperlipidemia: The patient is treated with atorvastatin 80 mg.  His lipids are followed by his PCP.  Most recent lab values are reviewed.  Current medicines are reviewed with the patient today.  The patient does not have concerns regarding medicines.  Labs/ tests ordered today include:  No orders of the defined types were placed in this encounter.  Disposition:   FU 6 months  Signed, Sherren Mocha, MD  12/22/2017 12:16 PM    [address], [place], [place]  [address] Phone: [phone]; Fax: [phone]

## 2017-12-22 NOTE — Patient Instructions (Signed)
Medication Instructions:  1) START ALDACTONE 12.5 mg daily  Labwork: TODAY: BMET, BNP  IN 2 WEEKS: BMET  Testing/Procedures: None  Follow-Up: Your provider wants you to follow-up in: 6 months with Dr. Burt Knack. You will receive a reminder letter in the mail two months in advance. If you don't receive a letter, please call our office to schedule the follow-up appointment.    Any Other Special Instructions Will Be Listed Below (If Applicable).     If you need a refill on your cardiac medications before your next appointment, please call your pharmacy.

## 2017-12-24 ENCOUNTER — Encounter: Payer: Self-pay | Admitting: Cardiovascular Disease

## 2018-01-05 ENCOUNTER — Other Ambulatory Visit: Payer: Medicare Other | Admitting: *Deleted

## 2018-01-05 DIAGNOSIS — J439 Emphysema, unspecified: Secondary | ICD-10-CM | POA: Diagnosis not present

## 2018-01-05 DIAGNOSIS — E785 Hyperlipidemia, unspecified: Secondary | ICD-10-CM

## 2018-01-05 DIAGNOSIS — I5022 Chronic systolic (congestive) heart failure: Secondary | ICD-10-CM | POA: Diagnosis not present

## 2018-01-05 DIAGNOSIS — R062 Wheezing: Secondary | ICD-10-CM | POA: Diagnosis not present

## 2018-01-05 DIAGNOSIS — J449 Chronic obstructive pulmonary disease, unspecified: Secondary | ICD-10-CM | POA: Diagnosis not present

## 2018-01-05 LAB — BASIC METABOLIC PANEL
BUN / CREAT RATIO: 21 (ref 10–24)
BUN: 24 mg/dL (ref 8–27)
CHLORIDE: 106 mmol/L (ref 96–106)
CO2: 22 mmol/L (ref 20–29)
Calcium: 9 mg/dL (ref 8.6–10.2)
Creatinine, Ser: 1.16 mg/dL (ref 0.76–1.27)
GFR, EST AFRICAN AMERICAN: 73 mL/min/{1.73_m2} (ref >59)
GFR, EST NON AFRICAN AMERICAN: 63 mL/min/{1.73_m2} (ref >59)
Glucose: 100 mg/dL — ABNORMAL HIGH (ref 65–99)
POTASSIUM: 4.5 mmol/L (ref 3.5–5.2)
Sodium: 142 mmol/L (ref 134–144)

## 2018-02-17 MED ORDER — LOSARTAN POTASSIUM 25 MG PO TABS: 25.0000 mg | ORAL_TABLET | Freq: Every day | ORAL | 2 refills | Status: AC

## 2018-02-17 NOTE — Addendum Note (Signed)
Addended by: Derl Barrow on: 02/17/2018 01:49 PM   Modules accepted: Orders

## 2018-03-09 ENCOUNTER — Other Ambulatory Visit: Payer: Self-pay | Admitting: Cardiovascular Disease

## 2018-03-09 MED ORDER — ATORVASTATIN CALCIUM 80 MG PO TABS: 80.0000 mg | ORAL_TABLET | Freq: Every day | ORAL | 2 refills | Status: AC

## 2018-04-06 DIAGNOSIS — R5382 Chronic fatigue, unspecified: Secondary | ICD-10-CM | POA: Diagnosis not present

## 2018-04-06 DIAGNOSIS — R062 Wheezing: Secondary | ICD-10-CM | POA: Diagnosis not present

## 2018-04-06 DIAGNOSIS — R739 Hyperglycemia, unspecified: Secondary | ICD-10-CM | POA: Diagnosis not present

## 2018-04-06 DIAGNOSIS — I10 Essential (primary) hypertension: Secondary | ICD-10-CM | POA: Diagnosis not present

## 2018-04-06 DIAGNOSIS — Z0000 Encounter for general adult medical examination without abnormal findings: Secondary | ICD-10-CM | POA: Diagnosis not present

## 2018-04-06 DIAGNOSIS — Z1211 Encounter for screening for malignant neoplasm of colon: Secondary | ICD-10-CM | POA: Diagnosis not present

## 2018-04-06 DIAGNOSIS — Z125 Encounter for screening for malignant neoplasm of prostate: Secondary | ICD-10-CM | POA: Diagnosis not present

## 2018-04-06 DIAGNOSIS — J449 Chronic obstructive pulmonary disease, unspecified: Secondary | ICD-10-CM | POA: Diagnosis not present

## 2018-05-03 ENCOUNTER — Other Ambulatory Visit: Payer: Self-pay | Admitting: Cardiovascular Disease

## 2018-05-19 ENCOUNTER — Other Ambulatory Visit: Payer: Self-pay | Admitting: Cardiovascular Disease

## 2018-05-19 MED ORDER — CLOPIDOGREL BISULFATE 75 MG PO TABS: 75.0000 mg | ORAL_TABLET | Freq: Every day | ORAL | 1 refills | Status: AC

## 2018-05-19 NOTE — Telephone Encounter (Signed)
Pt's medication was sent to pt's pharmacy as requested. Confirmation received.  °

## 2018-05-31 DIAGNOSIS — I5020 Unspecified systolic (congestive) heart failure: Secondary | ICD-10-CM | POA: Diagnosis not present

## 2018-05-31 DIAGNOSIS — I110 Hypertensive heart disease with heart failure: Secondary | ICD-10-CM | POA: Diagnosis not present

## 2018-05-31 DIAGNOSIS — N289 Disorder of kidney and ureter, unspecified: Secondary | ICD-10-CM | POA: Diagnosis not present

## 2018-05-31 DIAGNOSIS — R279 Unspecified lack of coordination: Secondary | ICD-10-CM | POA: Diagnosis not present

## 2018-05-31 DIAGNOSIS — I252 Old myocardial infarction: Secondary | ICD-10-CM | POA: Diagnosis not present

## 2018-05-31 DIAGNOSIS — J441 Chronic obstructive pulmonary disease with (acute) exacerbation: Secondary | ICD-10-CM | POA: Diagnosis not present

## 2018-05-31 DIAGNOSIS — N179 Acute kidney failure, unspecified: Secondary | ICD-10-CM | POA: Diagnosis not present

## 2018-05-31 DIAGNOSIS — R062 Wheezing: Secondary | ICD-10-CM | POA: Diagnosis not present

## 2018-05-31 DIAGNOSIS — I2699 Other pulmonary embolism without acute cor pulmonale: Secondary | ICD-10-CM | POA: Diagnosis not present

## 2018-05-31 DIAGNOSIS — I6523 Occlusion and stenosis of bilateral carotid arteries: Secondary | ICD-10-CM | POA: Diagnosis not present

## 2018-05-31 DIAGNOSIS — I10 Essential (primary) hypertension: Secondary | ICD-10-CM | POA: Diagnosis not present

## 2018-05-31 DIAGNOSIS — J449 Chronic obstructive pulmonary disease, unspecified: Secondary | ICD-10-CM | POA: Diagnosis not present

## 2018-05-31 DIAGNOSIS — I77811 Abdominal aortic ectasia: Secondary | ICD-10-CM | POA: Diagnosis not present

## 2018-05-31 DIAGNOSIS — R918 Other nonspecific abnormal finding of lung field: Secondary | ICD-10-CM | POA: Diagnosis not present

## 2018-05-31 DIAGNOSIS — Z7982 Long term (current) use of aspirin: Secondary | ICD-10-CM | POA: Diagnosis not present

## 2018-05-31 DIAGNOSIS — Z9861 Coronary angioplasty status: Secondary | ICD-10-CM | POA: Diagnosis not present

## 2018-05-31 DIAGNOSIS — J9601 Acute respiratory failure with hypoxia: Secondary | ICD-10-CM | POA: Diagnosis not present

## 2018-05-31 DIAGNOSIS — I5022 Chronic systolic (congestive) heart failure: Secondary | ICD-10-CM | POA: Diagnosis present

## 2018-05-31 DIAGNOSIS — R0602 Shortness of breath: Secondary | ICD-10-CM | POA: Diagnosis not present

## 2018-05-31 DIAGNOSIS — R9431 Abnormal electrocardiogram [ECG] [EKG]: Secondary | ICD-10-CM | POA: Diagnosis not present

## 2018-05-31 DIAGNOSIS — R000 Tachycardia, unspecified: Secondary | ICD-10-CM | POA: Diagnosis not present

## 2018-05-31 DIAGNOSIS — Z955 Presence of coronary angioplasty implant and graft: Secondary | ICD-10-CM | POA: Diagnosis not present

## 2018-05-31 DIAGNOSIS — Z743 Need for continuous supervision: Secondary | ICD-10-CM | POA: Diagnosis not present

## 2018-05-31 DIAGNOSIS — Z9911 Dependence on respirator [ventilator] status: Secondary | ICD-10-CM | POA: Diagnosis not present

## 2018-05-31 DIAGNOSIS — D72829 Elevated white blood cell count, unspecified: Secondary | ICD-10-CM | POA: Diagnosis present

## 2018-05-31 DIAGNOSIS — I714 Abdominal aortic aneurysm, without rupture: Secondary | ICD-10-CM | POA: Diagnosis present

## 2018-05-31 DIAGNOSIS — J9691 Respiratory failure, unspecified with hypoxia: Secondary | ICD-10-CM | POA: Diagnosis not present

## 2018-05-31 DIAGNOSIS — Z7901 Long term (current) use of anticoagulants: Secondary | ICD-10-CM | POA: Diagnosis not present

## 2018-05-31 DIAGNOSIS — Z7902 Long term (current) use of antithrombotics/antiplatelets: Secondary | ICD-10-CM | POA: Diagnosis not present

## 2018-05-31 DIAGNOSIS — I2510 Atherosclerotic heart disease of native coronary artery without angina pectoris: Secondary | ICD-10-CM | POA: Diagnosis not present

## 2018-05-31 DIAGNOSIS — R069 Unspecified abnormalities of breathing: Secondary | ICD-10-CM | POA: Diagnosis not present

## 2018-05-31 DIAGNOSIS — E785 Hyperlipidemia, unspecified: Secondary | ICD-10-CM | POA: Diagnosis not present

## 2018-05-31 DIAGNOSIS — Z87891 Personal history of nicotine dependence: Secondary | ICD-10-CM | POA: Diagnosis not present

## 2018-05-31 DIAGNOSIS — D513 Other dietary vitamin B12 deficiency anemia: Secondary | ICD-10-CM | POA: Diagnosis present

## 2018-06-07 DIAGNOSIS — I10 Essential (primary) hypertension: Secondary | ICD-10-CM | POA: Diagnosis not present

## 2018-06-07 DIAGNOSIS — E782 Mixed hyperlipidemia: Secondary | ICD-10-CM | POA: Diagnosis not present

## 2018-06-07 DIAGNOSIS — I2692 Saddle embolus of pulmonary artery without acute cor pulmonale: Secondary | ICD-10-CM | POA: Diagnosis not present

## 2018-06-07 DIAGNOSIS — J449 Chronic obstructive pulmonary disease, unspecified: Secondary | ICD-10-CM | POA: Diagnosis not present

## 2018-06-11 ENCOUNTER — Other Ambulatory Visit: Payer: Self-pay | Admitting: Cardiovascular Disease

## 2018-06-22 DIAGNOSIS — R0902 Hypoxemia: Secondary | ICD-10-CM | POA: Diagnosis not present

## 2018-06-22 DIAGNOSIS — R0602 Shortness of breath: Secondary | ICD-10-CM | POA: Diagnosis not present

## 2018-06-22 DIAGNOSIS — R000 Tachycardia, unspecified: Secondary | ICD-10-CM | POA: Diagnosis not present

## 2018-06-22 DIAGNOSIS — R05 Cough: Secondary | ICD-10-CM | POA: Diagnosis not present

## 2018-06-22 DIAGNOSIS — I959 Hypotension, unspecified: Secondary | ICD-10-CM | POA: Diagnosis not present

## 2018-06-23 DIAGNOSIS — I469 Cardiac arrest, cause unspecified: Secondary | ICD-10-CM | POA: Diagnosis not present

## 2018-06-23 DIAGNOSIS — Z86711 Personal history of pulmonary embolism: Secondary | ICD-10-CM | POA: Diagnosis not present

## 2018-06-23 DIAGNOSIS — B9789 Other viral agents as the cause of diseases classified elsewhere: Secondary | ICD-10-CM | POA: Diagnosis not present

## 2018-06-23 DIAGNOSIS — J9621 Acute and chronic respiratory failure with hypoxia: Secondary | ICD-10-CM | POA: Diagnosis present

## 2018-06-23 DIAGNOSIS — I499 Cardiac arrhythmia, unspecified: Secondary | ICD-10-CM | POA: Diagnosis not present

## 2018-06-23 DIAGNOSIS — I2699 Other pulmonary embolism without acute cor pulmonale: Secondary | ICD-10-CM | POA: Diagnosis not present

## 2018-06-23 DIAGNOSIS — K625 Hemorrhage of anus and rectum: Secondary | ICD-10-CM | POA: Diagnosis not present

## 2018-06-23 DIAGNOSIS — Z955 Presence of coronary angioplasty implant and graft: Secondary | ICD-10-CM | POA: Diagnosis not present

## 2018-06-23 DIAGNOSIS — R740 Nonspecific elevation of levels of transaminase and lactic acid dehydrogenase [LDH]: Secondary | ICD-10-CM | POA: Diagnosis not present

## 2018-06-23 DIAGNOSIS — I491 Atrial premature depolarization: Secondary | ICD-10-CM | POA: Diagnosis not present

## 2018-06-23 DIAGNOSIS — E785 Hyperlipidemia, unspecified: Secondary | ICD-10-CM | POA: Diagnosis present

## 2018-06-23 DIAGNOSIS — I255 Ischemic cardiomyopathy: Secondary | ICD-10-CM | POA: Diagnosis present

## 2018-06-23 DIAGNOSIS — R0600 Dyspnea, unspecified: Secondary | ICD-10-CM | POA: Diagnosis not present

## 2018-06-23 DIAGNOSIS — I110 Hypertensive heart disease with heart failure: Secondary | ICD-10-CM | POA: Diagnosis present

## 2018-06-23 DIAGNOSIS — J9600 Acute respiratory failure, unspecified whether with hypoxia or hypercapnia: Secondary | ICD-10-CM | POA: Diagnosis not present

## 2018-06-23 DIAGNOSIS — Z87891 Personal history of nicotine dependence: Secondary | ICD-10-CM | POA: Diagnosis not present

## 2018-06-23 DIAGNOSIS — R7989 Other specified abnormal findings of blood chemistry: Secondary | ICD-10-CM | POA: Diagnosis not present

## 2018-06-23 DIAGNOSIS — Z452 Encounter for adjustment and management of vascular access device: Secondary | ICD-10-CM | POA: Diagnosis not present

## 2018-06-23 DIAGNOSIS — I2510 Atherosclerotic heart disease of native coronary artery without angina pectoris: Secondary | ICD-10-CM | POA: Diagnosis present

## 2018-06-23 DIAGNOSIS — R0602 Shortness of breath: Secondary | ICD-10-CM | POA: Diagnosis not present

## 2018-06-23 DIAGNOSIS — J90 Pleural effusion, not elsewhere classified: Secondary | ICD-10-CM | POA: Diagnosis not present

## 2018-06-23 DIAGNOSIS — Z9989 Dependence on other enabling machines and devices: Secondary | ICD-10-CM | POA: Diagnosis not present

## 2018-06-23 DIAGNOSIS — B341 Enterovirus infection, unspecified: Secondary | ICD-10-CM | POA: Diagnosis not present

## 2018-06-23 DIAGNOSIS — B348 Other viral infections of unspecified site: Secondary | ICD-10-CM | POA: Diagnosis not present

## 2018-06-23 DIAGNOSIS — R195 Other fecal abnormalities: Secondary | ICD-10-CM | POA: Diagnosis not present

## 2018-06-23 DIAGNOSIS — J439 Emphysema, unspecified: Secondary | ICD-10-CM | POA: Diagnosis not present

## 2018-06-23 DIAGNOSIS — Z9981 Dependence on supplemental oxygen: Secondary | ICD-10-CM | POA: Diagnosis not present

## 2018-06-23 DIAGNOSIS — J441 Chronic obstructive pulmonary disease with (acute) exacerbation: Secondary | ICD-10-CM | POA: Diagnosis present

## 2018-06-23 DIAGNOSIS — R0689 Other abnormalities of breathing: Secondary | ICD-10-CM | POA: Diagnosis not present

## 2018-06-23 DIAGNOSIS — I517 Cardiomegaly: Secondary | ICD-10-CM | POA: Diagnosis not present

## 2018-06-23 DIAGNOSIS — D6832 Hemorrhagic disorder due to extrinsic circulating anticoagulants: Secondary | ICD-10-CM | POA: Diagnosis present

## 2018-06-23 DIAGNOSIS — N179 Acute kidney failure, unspecified: Secondary | ICD-10-CM | POA: Diagnosis not present

## 2018-06-23 DIAGNOSIS — E872 Acidosis: Secondary | ICD-10-CM | POA: Diagnosis not present

## 2018-06-23 DIAGNOSIS — I611 Nontraumatic intracerebral hemorrhage in hemisphere, cortical: Secondary | ICD-10-CM | POA: Diagnosis not present

## 2018-06-23 DIAGNOSIS — B9710 Unspecified enterovirus as the cause of diseases classified elsewhere: Secondary | ICD-10-CM | POA: Diagnosis not present

## 2018-06-23 DIAGNOSIS — R791 Abnormal coagulation profile: Secondary | ICD-10-CM | POA: Diagnosis not present

## 2018-06-23 DIAGNOSIS — I5020 Unspecified systolic (congestive) heart failure: Secondary | ICD-10-CM | POA: Diagnosis not present

## 2018-06-23 DIAGNOSIS — R9389 Abnormal findings on diagnostic imaging of other specified body structures: Secondary | ICD-10-CM | POA: Diagnosis not present

## 2018-06-23 DIAGNOSIS — R9431 Abnormal electrocardiogram [ECG] [EKG]: Secondary | ICD-10-CM | POA: Diagnosis not present

## 2018-06-23 DIAGNOSIS — I252 Old myocardial infarction: Secondary | ICD-10-CM | POA: Diagnosis not present

## 2018-06-23 DIAGNOSIS — Z7901 Long term (current) use of anticoagulants: Secondary | ICD-10-CM | POA: Diagnosis not present

## 2018-06-23 DIAGNOSIS — R918 Other nonspecific abnormal finding of lung field: Secondary | ICD-10-CM | POA: Diagnosis not present

## 2018-06-23 DIAGNOSIS — J449 Chronic obstructive pulmonary disease, unspecified: Secondary | ICD-10-CM | POA: Diagnosis not present

## 2018-06-23 DIAGNOSIS — R000 Tachycardia, unspecified: Secondary | ICD-10-CM | POA: Diagnosis not present

## 2018-06-23 DIAGNOSIS — J9601 Acute respiratory failure with hypoxia: Secondary | ICD-10-CM | POA: Diagnosis not present

## 2018-06-23 DIAGNOSIS — K922 Gastrointestinal hemorrhage, unspecified: Secondary | ICD-10-CM | POA: Diagnosis present

## 2018-06-23 DIAGNOSIS — R6510 Systemic inflammatory response syndrome (SIRS) of non-infectious origin without acute organ dysfunction: Secondary | ICD-10-CM | POA: Diagnosis not present

## 2018-06-23 DIAGNOSIS — J690 Pneumonitis due to inhalation of food and vomit: Secondary | ICD-10-CM | POA: Diagnosis not present

## 2018-06-23 DIAGNOSIS — I272 Pulmonary hypertension, unspecified: Secondary | ICD-10-CM | POA: Diagnosis present

## 2018-06-23 DIAGNOSIS — J930 Spontaneous tension pneumothorax: Secondary | ICD-10-CM | POA: Diagnosis not present

## 2018-06-23 DIAGNOSIS — I5022 Chronic systolic (congestive) heart failure: Secondary | ICD-10-CM | POA: Diagnosis present

## 2018-06-23 DIAGNOSIS — I959 Hypotension, unspecified: Secondary | ICD-10-CM | POA: Diagnosis not present

## 2018-06-23 DIAGNOSIS — D62 Acute posthemorrhagic anemia: Secondary | ICD-10-CM | POA: Diagnosis present

## 2018-06-23 DIAGNOSIS — I10 Essential (primary) hypertension: Secondary | ICD-10-CM | POA: Diagnosis not present

## 2018-06-23 DIAGNOSIS — K921 Melena: Secondary | ICD-10-CM | POA: Diagnosis present

## 2018-06-23 DIAGNOSIS — J9622 Acute and chronic respiratory failure with hypercapnia: Secondary | ICD-10-CM | POA: Diagnosis not present

## 2018-06-23 DIAGNOSIS — Z7952 Long term (current) use of systemic steroids: Secondary | ICD-10-CM | POA: Diagnosis not present

## 2018-07-24 DEATH — deceased

## 2018-07-27 ENCOUNTER — Ambulatory Visit: Payer: Medicare Other | Admitting: Cardiovascular Disease

## 2018-07-28 ENCOUNTER — Encounter: Payer: Self-pay | Admitting: Cardiovascular Disease

## 2018-09-08 ENCOUNTER — Encounter (HOSPITAL_COMMUNITY): Payer: Self-pay

## 2018-09-08 ENCOUNTER — Ambulatory Visit (HOSPITAL_COMMUNITY): Admission: RE | Admit: 2018-09-08 | Payer: Self-pay | Source: Ambulatory Visit

## 2018-09-29 ENCOUNTER — Ambulatory Visit (HOSPITAL_COMMUNITY)
Admission: RE | Admit: 2018-09-29 | Payer: Self-pay | Source: Ambulatory Visit | Attending: Cardiovascular Disease | Admitting: Cardiovascular Disease

## 2018-09-29 DIAGNOSIS — R0989 Other specified symptoms and signs involving the circulatory and respiratory systems: Secondary | ICD-10-CM

## 2020-02-27 ENCOUNTER — Encounter: Payer: Self-pay | Admitting: Cardiovascular Disease
